# Patient Record
Sex: Male | Born: 1975 | Race: White | Hispanic: No | Marital: Married | Smoking: Former smoker
Health system: Southern US, Community
[De-identification: ages and names within clinical notes are randomized; demographics above are authoritative.]

## PROBLEM LIST (undated history)

## (undated) HISTORY — PX: EYE SURGERY: SHX253

---

## 2006-10-24 ENCOUNTER — Ambulatory Visit: Payer: Self-pay | Admitting: Internal Medicine

## 2009-11-12 ENCOUNTER — Ambulatory Visit: Payer: Self-pay | Admitting: Internal Medicine

## 2011-07-28 ENCOUNTER — Ambulatory Visit: Payer: Self-pay | Admitting: Internal Medicine

## 2011-07-28 LAB — URINALYSIS, COMPLETE
Bacteria: NEGATIVE
Bilirubin,UR: NEGATIVE
Glucose,UR: NEGATIVE mg/dL (ref 0–75)
Ketone: NEGATIVE
Nitrite: NEGATIVE
Protein: NEGATIVE
Specific Gravity: 1.015 (ref 1.003–1.030)
Squamous Epithelial: NONE SEEN

## 2014-02-20 ENCOUNTER — Observation Stay: Payer: Self-pay | Admitting: Surgery

## 2014-02-20 LAB — CBC WITH DIFFERENTIAL/PLATELET
Basophil #: 0.1 10*3/uL (ref 0.0–0.1)
Basophil %: 0.6 %
Eosinophil #: 0.1 10*3/uL (ref 0.0–0.7)
Eosinophil %: 0.4 %
HCT: 48.5 % (ref 40.0–52.0)
HGB: 15.7 g/dL (ref 13.0–18.0)
Lymphocyte #: 1.4 10*3/uL (ref 1.0–3.6)
Lymphocyte %: 7.8 %
MCH: 28.4 pg (ref 26.0–34.0)
MCHC: 32.4 g/dL (ref 32.0–36.0)
MCV: 88 fL (ref 80–100)
Monocyte #: 0.7 x10 3/mm (ref 0.2–1.0)
Monocyte %: 3.8 %
NEUTROS ABS: 15.8 10*3/uL — AB (ref 1.4–6.5)
Neutrophil %: 87.4 %
PLATELETS: 213 10*3/uL (ref 150–440)
RBC: 5.54 10*6/uL (ref 4.40–5.90)
RDW: 13.2 % (ref 11.5–14.5)
WBC: 18.1 10*3/uL — AB (ref 3.8–10.6)

## 2014-02-20 LAB — COMPREHENSIVE METABOLIC PANEL
AST: 25 U/L (ref 15–37)
Albumin: 4.3 g/dL (ref 3.4–5.0)
Alkaline Phosphatase: 93 U/L
Anion Gap: 4 — ABNORMAL LOW (ref 7–16)
BUN: 10 mg/dL (ref 7–18)
Bilirubin,Total: 0.6 mg/dL (ref 0.2–1.0)
CHLORIDE: 104 mmol/L (ref 98–107)
CREATININE: 1.17 mg/dL (ref 0.60–1.30)
Calcium, Total: 9.4 mg/dL (ref 8.5–10.1)
Co2: 29 mmol/L (ref 21–32)
Glucose: 115 mg/dL — ABNORMAL HIGH (ref 65–99)
Osmolality: 274 (ref 275–301)
Potassium: 3.9 mmol/L (ref 3.5–5.1)
SGPT (ALT): 57 U/L
SODIUM: 137 mmol/L (ref 136–145)
TOTAL PROTEIN: 7.9 g/dL (ref 6.4–8.2)

## 2014-02-20 LAB — URINALYSIS, COMPLETE
BILIRUBIN, UR: NEGATIVE
Bacteria: NONE SEEN
Blood: NEGATIVE
Glucose,UR: NEGATIVE mg/dL (ref 0–75)
KETONE: NEGATIVE
Leukocyte Esterase: NEGATIVE
Nitrite: NEGATIVE
PH: 7 (ref 4.5–8.0)
PROTEIN: NEGATIVE
Specific Gravity: 1.028 (ref 1.003–1.030)
Squamous Epithelial: NONE SEEN
WBC UR: 1 /HPF (ref 0–5)

## 2014-02-20 LAB — APTT: Activated PTT: 25.2 secs (ref 23.6–35.9)

## 2014-02-20 LAB — LIPASE, BLOOD: Lipase: 136 U/L (ref 73–393)

## 2014-02-24 LAB — PATHOLOGY REPORT

## 2014-09-11 NOTE — Discharge Summary (Signed)
PATIENT NAME:  Jon [name], Jon [name] DATE OF BIRTH:  22-Dec-1975  DATE OF ADMISSION:  02/20/2014 DATE OF DISCHARGE:  02/21/2014  BRIEF HISTORY: Jon [name] is a 39 year old gentleman admitted through the emergency room with evidence of acute appendicitis. CT evidence and clinical presentation confirmed the diagnosis. He was taken to surgery that afternoon, where he underwent a laparoscopic appendectomy. The procedure was uncomplicated. He had no significant intraoperative problems. He was up, active, tolerating a diet the following morning. His wounds looked good. There was no sign of any infection. He is discharged home today to be followed in the office later this week.   DISCHARGE MEDICATIONS: Include ranitidine 150 mg p.o. daily p.r.n., hydrocodone and acetaminophen 300 mg/5 mg every 4 to 6 hours p.r.n. pain.   FINAL DISCHARGE DIAGNOSIS: Acute appendicitis.   SURGERY: Laparoscopic appendectomy.     ____________________________ Carmie Endalph L. Ely III, MD rle:TT D: 02/21/2014 09:37:00 ET T: 02/21/2014 16:42:50 ET JOB#: [id number]  cc: Carmie Endalph L. Ely III, MD, <Dictator> Marya AmslerMarshall W. Dareen PianoAnderson, MD Quentin OreALPH L ELY MD ELECTRONICALLY SIGNED 02/21/2014 17:48

## 2014-09-11 NOTE — H&P (Signed)
PATIENT NAME:  Jon BuzzardMORRIS, Massey B MR#:  [id number] DATE OF BIRTH:  Apr 28, 1976  DATE OF ADMISSION:  02/20/2014  PRIMARY CARE PHYSICIAN: Marya AmslerMarshall W. Dareen PianoAnderson, MD.   ADMITTING PHYSICIAN:  Quentin Orealph L. Ely III, MD   CHIEF COMPLAINT: Abdominal pain, nausea, vomiting.   BRIEF HISTORY: Jon [name] is a 39 year old gentleman seen in the Emergency Room with approximately a 12-hour history of abdominal pain. He was in his normal state of good health yesterday with no particular complaints. He went to bed normally last evening. He developed significant generalized abdominal pain at 3:00 in the morning waking him from sleep. The pain was followed soon after with profuse nausea and vomiting. The pain migrated to the right lower quadrant. This morning he presented to the emergency room for further evaluation. Denies any previous similar symptoms. He does have a history of nephrolithiasis but no other GI problems. Specifically, he denies a history of hepatitis, yellow jaundice, pancreatitis, peptic ulcer disease, gallbladder disease, or diverticulitis. He does have a history of gastroesophageal reflux disease. He has undergone endoscopy. He does not have any surgical history. He has no major medical problems including no history of cardiac disease, hypertension, diabetes or thyroid problems. He is not a cigarette smoker. He does not drink alcohol regularly.   MEDICATIONS: He takes no medications other than occasional H2-blocker.   ALLERGIES:  No medical allergies.   REVIEW OF SYSTEMS:  Otherwise unremarkable.   FAMILY HISTORY:  Noncontributory to the current illness.   PHYSICAL EXAMINATION:  GENERAL: He is an alert, pleasant gentleman in moderate distress from pain.  VITAL SIGNS: Blood pressure 104/68, heart rate is 68 and regular. He is afebrile.  HEENT: Unremarkable. He has no scleral icterus, no pupillary abnormalities. No facial deformities.  NECK: Supple, nontender, with midline trachea.  CHEST: Clear with  no adventitious sounds. He has normal pulmonary excursion.  CARDIAC: No murmurs or gallops to my ear. He seems to be in normal sinus rhythm.  ABDOMEN: Soft with some generalized right lower quadrant tenderness; guarding, but no rebound. He does have point tenderness in the right lower quadrant. He has active bowel sounds.  EXTREMITIES: Full range of motion. No deformities.  PSYCHIATRIC: Normal orientation, normal affect.   ASSESSMENT AND PLAN: I have independently reviewed his CT scan. He does appear to have a thickened appendix with appendicolith and mild periappendiceal stranding consistent with acute appendicitis. His clinical presentation would support that diagnosis. We will set him up for intervention. I have discussed antibiotic therapy versus surgical intervention with him. He would prefer surgery. His wife is in agreement with the plan.    ____________________________ Carmie Endalph L. Ely III, MD rle:lt D: 02/20/2014 13:51:00 ET T: 02/20/2014 14:25:23 ET JOB#: [id number]  cc: Carmie Endalph L. Ely III, MD, <Dictator> Marya AmslerMarshall W. Dareen PianoAnderson, MD Quentin OreALPH L ELY MD ELECTRONICALLY SIGNED 02/21/2014 9:44

## 2014-09-11 NOTE — Op Note (Signed)
PATIENT NAME:  Jon [name], Jon [name] MR#:  [id number] DATE OF BIRTH:  1975-12-30  DATE OF PROCEDURE:  02/20/2014   PREOPERATIVE DIAGNOSIS: Acute appendicitis.   POSTOPERATIVE DIAGNOSIS: Acute appendicitis.   PROCEDURE PERFORMED: Laparoscopic appendectomy.   ANESTHESIA: General.   SURGEON: Quentin Orealph L. Ely, MD   OPERATIVE PROCEDURE: With the patient is a position, after induction of appropriate general anesthesia, the patient's abdomen was prepped with ChloraPrep and draped with sterile towels. The patient was placed in the head down and feet up position. A small infraumbilical incision was made in the standard fashion, and carried down bluntly through the subcutaneous tissue. A Veress needle was used to cannulate the peritoneal cavity. CO2 was insufflated to appropriate pressure measurements. When approximately 2.5 L of CO2 were instilled, the Veress needle was withdrawn and an 11 mm [employer] port was inserted into the peritoneal cavity. Intraperitoneal position was confirmed. CO2 was reinsufflated. A transverse upper abdominal incision was made in the midline, and an 11 mm port was inserted under direct vision. The right lower quadrant was investigated. The appendix was thickened, inflamed at the tip, mildly edematous. It appeared to be almost completely occluded with debris.   A suprapubic transverse incision was made and a 12 mm port inserted under direct vision. The camera was moved to the upper port and dissection carried out through the 2 lower ports. Mesoappendix was divided with 2 applications of the Endo GIA stapling device carrying a white load. The base of the appendix was divided right at the cecum using a single application of the Endo GIA stapler carrying a blue load. The appendix was captured in an Endo Catch apparatus and removed through the suprapubic incision.   The area was copiously irrigated with warm saline solution. The lower midline 12 mm incision was closed with a  figure-of-eight suture of 0 Vicryl using the suture passer. The abdomen was desufflated. All ports were withdrawn without difficult. The skin incisions were closed with 5-0 nylon. The area was infiltrated with 0.25% Marcaine for postoperative pain control. Sterile dressings were applied. The patient was returned to the recovery room, having tolerated the procedure well. Sponge, needle, and instrument counts were correct x 2 in the operating room.    ____________________________ Quentin Orealph L. Ely III, MD rle:MT D: 02/20/2014 17:21:55 ET T: 02/21/2014 08:55:49 ET JOB#: [id number]  cc: Quentin Orealph L. Ely III, MD, <Dictator> Quentin OreALPH L ELY MD ELECTRONICALLY SIGNED 02/21/2014 9:45

## 2015-07-19 ENCOUNTER — Ambulatory Visit: Admission: EM | Admit: 2015-07-19 | Discharge: 2015-07-19 | Discharge status: Absent without leave | Payer: Self-pay

## 2016-10-17 ENCOUNTER — Encounter: Payer: Self-pay | Admitting: Emergency Medicine

## 2016-10-17 ENCOUNTER — Ambulatory Visit
Admission: EM | Admit: 2016-10-17 | Discharge: 2016-10-17 | Discharge status: Absent without leave | Payer: Managed Care, Other (non HMO)

## 2016-10-17 NOTE — ED Triage Notes (Signed)
Patient c/o cough and chest congestion for 2 days.   

## 2017-07-11 DIAGNOSIS — K8050 Calculus of bile duct without cholangitis or cholecystitis without obstruction: Secondary | ICD-10-CM | POA: Insufficient documentation

## 2017-08-19 HISTORY — PX: CHOLECYSTECTOMY: SHX55

## 2017-12-09 ENCOUNTER — Ambulatory Visit
Admission: EM | Admit: 2017-12-09 | Discharge: 2017-12-09 | Disposition: A | Discharge status: Home / Self Care | Payer: Managed Care, Other (non HMO) | Attending: Family Medicine | Admitting: Family Medicine

## 2017-12-09 DIAGNOSIS — M545 Low back pain: Secondary | ICD-10-CM

## 2017-12-09 DIAGNOSIS — S39012A Strain of muscle, fascia and tendon of lower back, initial encounter: Secondary | ICD-10-CM | POA: Diagnosis not present

## 2017-12-09 DIAGNOSIS — R109 Unspecified abdominal pain: Secondary | ICD-10-CM | POA: Diagnosis not present

## 2017-12-09 LAB — URINALYSIS, COMPLETE (UACMP) WITH MICROSCOPIC
Glucose, UA: NEGATIVE mg/dL
Ketones, ur: NEGATIVE mg/dL
Leukocytes, UA: NEGATIVE
Nitrite: NEGATIVE
PH: 5.5 (ref 5.0–8.0)
Protein, ur: 100 mg/dL — AB
Squamous Epithelial / LPF: NONE SEEN (ref 0–5)

## 2017-12-09 MED ORDER — TAMSULOSIN HCL 0.4 MG PO CAPS: 0.4000 mg | ORAL_CAPSULE | Freq: Every day | ORAL | 0 refills | Status: DC

## 2017-12-09 MED ORDER — CYCLOBENZAPRINE HCL 10 MG PO TABS: 10.0000 mg | ORAL_TABLET | Freq: Every day | ORAL | 0 refills | Status: DC

## 2017-12-09 MED ORDER — HYDROCODONE-ACETAMINOPHEN 5-325 MG PO TABS: ORAL_TABLET | ORAL | 0 refills | Status: DC

## 2017-12-09 NOTE — Discharge Instructions (Signed)
Increase water intake Strain urine

## 2017-12-09 NOTE — ED Provider Notes (Signed)
[facility]    CSN: [id number] Arrival date & time: 12/09/17  1510     History   Chief Complaint Chief Complaint  Patient presents with  . Flank Pain    HPI Jon [name] is a 42 y.o. male.   42 yo male with a c/o right flank pain and right lower back pain for the past 3 days associated with darker urine and pain radiating down towards the right groin. States he has a h/o kidney stones and low back musculoskeletal problems in the past and current symptoms similar to both so he states he's not sure which one it is. Denies any fevers, chills, nausea or vomiting.   The history is provided by the patient.  Flank Pain     History reviewed. No pertinent past medical history.  There are no active problems to display for this patient.   Past Surgical History:  Procedure Laterality Date  . EYE SURGERY         Home Medications    Prior to Admission medications   Medication Sig Start Date End Date Taking? Authorizing Provider  cyclobenzaprine (FLEXERIL) 10 MG tablet Take 1 tablet (10 mg total) by mouth at bedtime. 12/09/17   Payton Mccallumonty, Eadie Repetto, MD  HYDROcodone-acetaminophen (NORCO/VICODIN) 5-325 MG tablet 1-2 tabs po q 8 hours prn 12/09/17   Payton Mccallumonty, Alvino Lechuga, MD  tamsulosin (FLOMAX) 0.4 MG CAPS capsule Take 1 capsule (0.4 mg total) by mouth daily. 12/09/17   Payton Mccallumonty, Damara Klunder, MD    Family History No family history on file.  Social History Social History   Tobacco Use  . Smoking status: Former Games developermoker  . Smokeless tobacco: Current User    Types: Snuff  Substance Use Topics  . Alcohol use: No  . Drug use: Not on file     Allergies   Patient has no known allergies.   Review of Systems Review of Systems  Genitourinary: Positive for flank pain.     Physical Exam Triage Vital Signs ED Triage Vitals  Enc Vitals Group     BP 12/09/17 1526 124/84     Pulse Rate 12/09/17 1526 81     Resp 12/09/17 1526 18     Temp 12/09/17 1526 98.6 F (37 C)     Temp  Source 12/09/17 1526 Oral     SpO2 12/09/17 1526 97 %     Weight 12/09/17 1529 250 lb (113.4 kg)     Height --      Head Circumference --      Peak Flow --      Pain Score 12/09/17 1529 6     Pain Loc --      Pain Edu? --      Excl. in GC? --    No data found.  Updated Vital Signs BP 124/84 (BP Location: Left Arm)   Pulse 81   Temp 98.6 F (37 C) (Oral)   Resp 18   Wt 250 lb (113.4 kg)   SpO2 97%   BMI 36.92 kg/m   Visual Acuity Right Eye Distance:   Left Eye Distance:   Bilateral Distance:    Right Eye Near:   Left Eye Near:    Bilateral Near:     Physical Exam  Constitutional: He appears well-developed and well-nourished. No distress.  Abdominal: Soft. Bowel sounds are normal. He exhibits no distension and no mass. There is tenderness (mild; right flank). There is no rebound and no guarding. No hernia.  Musculoskeletal:  Lumbar back: He exhibits tenderness (lumbar sacral paraspinous muscles).  Skin: He is not diaphoretic.  Nursing note and vitals reviewed.    UC Treatments / Results  Labs (all labs ordered are listed, but only abnormal results are displayed) Labs Reviewed  URINALYSIS, COMPLETE (UACMP) WITH MICROSCOPIC - Abnormal; Notable for the following components:      Result Value   APPearance HAZY (*)    Specific Gravity, Urine >1.030 (*)    Hgb urine dipstick LARGE (*)    Bilirubin Urine SMALL (*)    Protein, ur 100 (*)    Bacteria, UA RARE (*)    All other components within normal limits    EKG None  Radiology No results found.  Procedures Procedures (including critical care time)  Medications Ordered in UC Medications - No data to display  Initial Impression / Assessment and Plan / UC Course  I have reviewed the triage vital signs and the nursing notes.  Pertinent labs & imaging results that were available during my care of the patient were reviewed by me and considered in my medical decision making (see chart for details).        Final Clinical Impressions(s) / UC Diagnoses   Final diagnoses:  Right flank pain  Strain of lumbar region, initial encounter     Discharge Instructions     Increase water intake Strain urine    ED Prescriptions    Medication Sig Dispense Auth. Provider   tamsulosin (FLOMAX) 0.4 MG CAPS capsule Take 1 capsule (0.4 mg total) by mouth daily. 30 capsule Payton Mccallum, MD   cyclobenzaprine (FLEXERIL) 10 MG tablet Take 1 tablet (10 mg total) by mouth at bedtime. 30 tablet Davian Wollenberg, Pamala Hurry, MD   HYDROcodone-acetaminophen (NORCO/VICODIN) 5-325 MG tablet 1-2 tabs po q 8 hours prn 10 tablet Payton Mccallum, MD     1. Lab results and diagnosis reviewed with patient 2. rx as per orders above; reviewed possible side effects, interactions, risks and benefits  3. Recommend supportive treatment as above 4. Follow-up prn if symptoms worsen or don't improve Controlled Substance Prescriptions [place] Controlled Substance Registry consulted? Not Applicable   Payton Mccallum, MD 12/09/17 [phone]

## 2017-12-09 NOTE — ED Triage Notes (Signed)
Started out flank pain on right side since the weekend and states it felt like a kidney stone and now is getting worse and wrapping around his right side and into the right side of his pelvic. Does have a hx of back pain and ibuprofen isn't touching it. Pain is constant. No urinary trouble mentioned.

## 2017-12-16 ENCOUNTER — Encounter: Payer: Self-pay | Admitting: Emergency Medicine

## 2017-12-16 ENCOUNTER — Ambulatory Visit
Admission: EM | Admit: 2017-12-16 | Discharge: 2017-12-16 | Disposition: A | Discharge status: Home / Self Care | Payer: Managed Care, Other (non HMO) | Attending: Family Medicine | Admitting: Family Medicine

## 2017-12-16 ENCOUNTER — Other Ambulatory Visit: Payer: Self-pay

## 2017-12-16 ENCOUNTER — Ambulatory Visit (INDEPENDENT_AMBULATORY_CARE_PROVIDER_SITE_OTHER): Payer: Managed Care, Other (non HMO)

## 2017-12-16 DIAGNOSIS — N200 Calculus of kidney: Secondary | ICD-10-CM | POA: Diagnosis not present

## 2017-12-16 DIAGNOSIS — N1330 Unspecified hydronephrosis: Secondary | ICD-10-CM

## 2017-12-16 DIAGNOSIS — N201 Calculus of ureter: Secondary | ICD-10-CM

## 2017-12-16 DIAGNOSIS — R109 Unspecified abdominal pain: Secondary | ICD-10-CM | POA: Diagnosis not present

## 2017-12-16 LAB — URINALYSIS, COMPLETE (UACMP) WITH MICROSCOPIC
BILIRUBIN URINE: NEGATIVE
Bacteria, UA: NONE SEEN
GLUCOSE, UA: NEGATIVE mg/dL
HGB URINE DIPSTICK: NEGATIVE
Ketones, ur: NEGATIVE mg/dL
NITRITE: NEGATIVE
PH: 5.5 (ref 5.0–8.0)
Protein, ur: NEGATIVE mg/dL
SPECIFIC GRAVITY, URINE: 1.015 (ref 1.005–1.030)

## 2017-12-16 MED ORDER — OXYCODONE-ACETAMINOPHEN 5-325 MG PO TABS: 1.0000 | ORAL_TABLET | Freq: Three times a day (TID) | ORAL | 0 refills | Status: DC | PRN

## 2017-12-16 NOTE — Discharge Instructions (Signed)
[facility]  Continue flomax.  Pain medication as directed.  Take care  Dr. Adriana Simasook

## 2017-12-16 NOTE — ED Provider Notes (Signed)
[facility]    CSN: [id number] Arrival date & time: 12/16/17  1212  History   Chief Complaint Chief Complaint  Patient presents with  . Back Pain   HPI  42 year old male presents with right flank pain.  Patient with ongoing right flank and groin pain.  Was seen on 7/22.  Suspected kidney stone.  Was placed on Flomax, pain medication and was also given Flexeril.  Patient states that he has not passed a stone.  His symptoms have yet to resolve.  However, he states that he has been feeling better over the past 2 days.  No fevers or chills.  No urinary symptoms.  However continues to have intermittent pain.  Pain is severe at times.  No reports of hematuria.  No other associated symptoms.  No other complaints at this time.   Social History Social History   Tobacco Use  . Smoking status: Former Games developermoker  . Smokeless tobacco: Current User    Types: Snuff  Substance Use Topics  . Alcohol use: No  . Drug use: Not on file     Allergies   Patient has no known allergies.   Review of Systems Review of Systems  Constitutional: Negative.   Genitourinary: Positive for flank pain.   Physical Exam Triage Vital Signs ED Triage Vitals  Enc Vitals Group     BP 12/16/17 1240 (!) 150/96     Pulse Rate 12/16/17 1240 70     Resp 12/16/17 1240 16     Temp 12/16/17 1240 98.6 F (37 C)     Temp Source 12/16/17 1240 Oral     SpO2 12/16/17 1240 98 %     Weight 12/16/17 1239 250 lb (113.4 kg)     Height 12/16/17 1239 5\' 10"  (1.778 m)     Head Circumference --      Peak Flow --      Pain Score 12/16/17 1237 1     Pain Loc --      Pain Edu? --      Excl. in GC? --    Updated Vital Signs BP (!) 150/96 (BP Location: Left Arm)   Pulse 70   Temp 98.6 F (37 C) (Oral)   Resp 16   Ht 5\' 10"  (1.778 m)   Wt 250 lb (113.4 kg)   SpO2 98%   BMI 35.87 kg/m   Visual Acuity Right Eye Distance:   Left Eye Distance:   Bilateral Distance:    Right Eye Near:   Left Eye Near:      Bilateral Near:     Physical Exam  Constitutional: He is oriented to person, place, and time. He appears well-developed. No distress.  HENT:  Head: Normocephalic and atraumatic.  Cardiovascular: Normal rate and regular rhythm.  Pulmonary/Chest: Effort normal and breath sounds normal.  Abdominal: Soft. He exhibits no distension. There is no tenderness.  Neurological: He is alert and oriented to person, place, and time.  Psychiatric: He has a normal mood and affect. His behavior is normal.  Nursing note and vitals reviewed.  UC Treatments / Results  Labs (all labs ordered are listed, but only abnormal results are displayed) Labs Reviewed  URINALYSIS, COMPLETE (UACMP) WITH MICROSCOPIC - Abnormal; Notable for the following components:      Result Value   Leukocytes, UA TRACE (*)    All other components within normal limits    EKG None  Radiology Ct Renal Stone Study  Result Date: 12/16/2017 CLINICAL DATA:  Right flank pain, gross hematuria. EXAM: CT ABDOMEN AND PELVIS WITHOUT CONTRAST TECHNIQUE: Multidetector CT imaging of the abdomen and pelvis was performed following the standard protocol without IV contrast. COMPARISON:  CT scan of February 20, 2014. FINDINGS: Lower chest: No acute abnormality. Hepatobiliary: No focal liver abnormality is seen. Status post cholecystectomy. No biliary dilatation. Pancreas: Unremarkable. No pancreatic ductal dilatation or surrounding inflammatory changes. Spleen: Normal in size without focal abnormality. Adrenals/Urinary Tract: Adrenal glands appear normal. Bilateral nephrolithiasis is noted. Mild right hydroureteronephrosis is noted secondary to 7 mm calculus in midportion of right ureter. There is also noted 11 x 7 mm calculus in distal right ureter just proximal to ureterovesical junction. Urinary bladder is otherwise unremarkable. Stomach/Bowel: The stomach appears normal. Status post appendectomy. There is no evidence of bowel obstruction or  inflammation. Vascular/Lymphatic: No significant vascular findings are present. No enlarged abdominal or pelvic lymph nodes. Reproductive: Prostate is unremarkable. Other: No abdominal wall hernia or abnormality. No abdominopelvic ascites. Musculoskeletal: No acute or significant osseous findings. IMPRESSION: Bilateral nephrolithiasis. Mild right hydroureteronephrosis is noted with 7 mm calculus in midportion of right ureter as well as 11 x 7 mm calculus in distal right ureter just proximal to ureterovesical junction. Electronically Signed   By: Lupita Raider, M.D.   On: 12/16/2017 13:33    Procedures Procedures (including critical care time)  Medications Ordered in UC Medications - No data to display  Initial Impression / Assessment and Plan / UC Course  I have reviewed the triage vital signs and the nursing notes.  Pertinent labs & imaging results that were available during my care of the patient were reviewed by me and considered in my medical decision making (see chart for details).    42 year old male presents with suspected stone.  CT confirmed.  Patient has 2 stones.  Continue Flomax.  Pain medication given after consultation with the [place] controlled substance database.  Advised to see urology as he will likely need lithotripsy.  Final Clinical Impressions(s) / UC Diagnoses   Final diagnoses:  Ureterolithiasis     Discharge Instructions     Call to see [facility].  Continue flomax.  Pain medication as directed.  Take care  Dr. Adriana Simas    ED Prescriptions    Medication Sig Dispense Auth. Provider   oxyCODONE-acetaminophen (PERCOCET/ROXICET) 5-325 MG tablet Take 1 tablet by mouth every 8 (eight) hours as needed for severe pain. 15 tablet Tommie Sams, DO     Controlled Substance Prescriptions [place] Controlled Substance Registry consulted? Not Applicable   Tommie Sams, DO 12/16/17 1419

## 2017-12-16 NOTE — ED Triage Notes (Signed)
Pt here c/o flank pain pain. He was seen on 12/09/17 for the same pain. Strain vs. Kidney stone. Pt says his pain is not getting better. He was given Flomax, flexeril, and hydrocodone/APAP. He did not feel any relief with these.

## 2017-12-18 ENCOUNTER — Encounter: Payer: Self-pay | Admitting: Urology

## 2017-12-18 ENCOUNTER — Ambulatory Visit (INDEPENDENT_AMBULATORY_CARE_PROVIDER_SITE_OTHER): Payer: Managed Care, Other (non HMO) | Admitting: Urology

## 2017-12-18 ENCOUNTER — Telehealth: Payer: Self-pay | Admitting: Radiology

## 2017-12-18 ENCOUNTER — Other Ambulatory Visit: Payer: Self-pay | Admitting: Radiology

## 2017-12-18 ENCOUNTER — Other Ambulatory Visit: Payer: Self-pay

## 2017-12-18 VITALS — BP 125/78 | HR 70 | Ht 70.0 in | Wt 250.0 lb

## 2017-12-18 DIAGNOSIS — N200 Calculus of kidney: Secondary | ICD-10-CM

## 2017-12-18 DIAGNOSIS — N201 Calculus of ureter: Secondary | ICD-10-CM

## 2017-12-18 DIAGNOSIS — N23 Unspecified renal colic: Secondary | ICD-10-CM | POA: Diagnosis not present

## 2017-12-18 LAB — MICROSCOPIC EXAMINATION
BACTERIA UA: NONE SEEN
EPITHELIAL CELLS (NON RENAL): NONE SEEN /HPF (ref 0–10)

## 2017-12-18 LAB — URINALYSIS, COMPLETE
BILIRUBIN UA: NEGATIVE
Glucose, UA: NEGATIVE
Ketones, UA: NEGATIVE
LEUKOCYTES UA: NEGATIVE
Nitrite, UA: NEGATIVE
PH UA: 5.5 (ref 5.0–7.5)
PROTEIN UA: NEGATIVE
Specific Gravity, UA: 1.015 (ref 1.005–1.030)
Urobilinogen, Ur: 0.2 mg/dL (ref 0.2–1.0)

## 2017-12-18 NOTE — Telephone Encounter (Signed)
No ultrasound needed.

## 2017-12-18 NOTE — H&P (View-Only) (Signed)
12/18/2017 8:38 AM   Jon [name] 05-02-1976 [id number]  Referring provider: [facility], [facility] [address] [facility] [place], [place] [address]  Chief Complaint  Patient presents with  . Nephrolithiasis    HPI: 42 year old male presents for evaluation of ureteral colic secondary to right ureteral calculi.  He presented to the ED on 12/09/2017 complaining of right flank pain.  He was diagnosed with lumbar strain but was also given tamsulosin for a possible stone.  He has had intermittent pain for the past 2 weeks.  On 12/16/2017 he had increased pain which was more localized to the right pelvis/right lower quadrant.  He denied fever, chills.  No bothersome lower urinary tract symptoms.  There were no identifiable precipitating, aggravating or alleviating factors to his pain.  The pain was intermittent and described as severe at times.  He went back to the ED on 7/29 and a CT was performed which showed an 8 x 11 mm right distal ureteral calculus and a 7 mm right lower proximal calculus with mild hydronephrosis/hydroureter.  He was discharged on oxycodone.  His pain has been controlled.  He has a long history of recurrent stone disease and states he usually passes 2-3 stones per year.  He has never required urologic intervention.  His CT did show small, bilateral renal calculi.   PMH: History reviewed. No pertinent past medical history.  Surgical History: Past Surgical History:  Procedure Laterality Date  . CHOLECYSTECTOMY  08/2017  . EYE SURGERY      Home Medications:  Allergies as of 12/18/2017      Reactions   Vancomycin Itching      Medication List        Accurate as of 12/18/17  8:38 AM. Always use your most recent med list.          oxyCODONE-acetaminophen 5-325 MG tablet Commonly known as:  PERCOCET/ROXICET Take 1 tablet by mouth every 8 (eight) hours as needed for severe pain.   tamsulosin 0.4 MG Caps capsule Commonly known as:  FLOMAX Take 1  capsule (0.4 mg total) by mouth daily.       Allergies:  Allergies  Allergen Reactions  . Vancomycin Itching    Family History: History reviewed. No pertinent family history.  Social History:  reports that he has quit smoking. His smokeless tobacco use includes snuff. He reports that he does not drink alcohol. His drug history is not on file.  ROS: UROLOGY Frequent Urination?: No Hard to postpone urination?: No Burning/pain with urination?: No Get up at night to urinate?: No Leakage of urine?: No Urine stream starts and stops?: No Trouble starting stream?: No Do you have to strain to urinate?: No Blood in urine?: Yes Urinary tract infection?: No Sexually transmitted disease?: No Injury to kidneys or bladder?: No Painful intercourse?: No Weak stream?: No Erection problems?: No Penile pain?: No  Gastrointestinal Nausea?: No Vomiting?: No Indigestion/heartburn?: No Diarrhea?: No Constipation?: No  Constitutional Fever: No Night sweats?: No Weight loss?: No Fatigue?: No  Skin Skin rash/lesions?: No Itching?: No  Eyes Blurred vision?: No Double vision?: No  Ears/Nose/Throat Sore throat?: No Sinus problems?: No  Hematologic/Lymphatic Swollen glands?: No Easy bruising?: No  Cardiovascular Leg swelling?: No Chest pain?: No  Respiratory Cough?: No Shortness of breath?: No  Endocrine Excessive thirst?: No  Musculoskeletal Back pain?: Yes Joint pain?: No  Neurological Headaches?: No Dizziness?: No  Psychologic Depression?: No Anxiety?: No  Physical Exam: BP 125/78   Pulse 70  Ht 5\' 10"  (1.778 m)   Wt 250 lb (113.4 kg)   BMI 35.87 kg/m   Constitutional:  Alert and oriented, No acute distress. HEENT: [place] AT, moist mucus membranes.  Trachea midline, no masses. Cardiovascular: No clubbing, cyanosis, or edema.  RRR Respiratory: Normal respiratory effort, no increased work of breathing.  Lungs clear GI: Abdomen is soft, nontender,  nondistended, no abdominal masses GU: No CVA tenderness Lymph: No cervical or inguinal lymphadenopathy. Skin: No rashes, bruises or suspicious lesions. Neurologic: Grossly intact, no focal deficits, moving all 4 extremities. Psychiatric: Normal mood and affect.  Laboratory Data: Dipstick trace blood/leukocyte negative; microscopy negative Pertinent Imaging: CT personally reviewed  Results for orders placed during the hospital encounter of 12/16/17  CT Renal Stone Study   Narrative CLINICAL DATA:  Right flank pain, gross hematuria.  EXAM: CT ABDOMEN AND PELVIS WITHOUT CONTRAST  TECHNIQUE: Multidetector CT imaging of the abdomen and pelvis was performed following the standard protocol without IV contrast.  COMPARISON:  CT scan of February 20, 2014.  FINDINGS: Lower chest: No acute abnormality.  Hepatobiliary: No focal liver abnormality is seen. Status post cholecystectomy. No biliary dilatation.  Pancreas: Unremarkable. No pancreatic ductal dilatation or surrounding inflammatory changes.  Spleen: Normal in size without focal abnormality.  Adrenals/Urinary Tract: Adrenal glands appear normal. Bilateral nephrolithiasis is noted. Mild right hydroureteronephrosis is noted secondary to 7 mm calculus in midportion of right ureter. There is also noted 11 x 7 mm calculus in distal right ureter just proximal to ureterovesical junction. Urinary bladder is otherwise unremarkable.  Stomach/Bowel: The stomach appears normal. Status post appendectomy. There is no evidence of bowel obstruction or inflammation.  Vascular/Lymphatic: No significant vascular findings are present. No enlarged abdominal or pelvic lymph nodes.  Reproductive: Prostate is unremarkable.  Other: No abdominal wall hernia or abnormality. No abdominopelvic ascites.  Musculoskeletal: No acute or significant osseous findings.  IMPRESSION: Bilateral nephrolithiasis. Mild right hydroureteronephrosis is  noted with 7 mm calculus in midportion of right ureter as well as 11 x 7 mm calculus in distal right ureter just proximal to ureterovesical junction.   Electronically Signed   By: Lupita Raider, M.D.   On: 12/16/2017 13:33     Assessment & Plan:   42 year old male with right renal colic secondary to right ureteral calculi.  Based on stone size he was informed it is unlikely he will be able to pass spontaneously.  Treatment options were discussed including ureteroscopy and shockwave lithotripsy.  The pros and cons of each procedure were reviewed in detail.  He elects to have the procedure which will get him better the quickest and will schedule ureteroscopic removal this week.  The indications and nature of the planned procedure were discussed as well as the potential  benefits and expected outcome.  Alternatives have been discussed in detail. The most common complications and side effects were discussed including but not limited to infection/sepsis; blood loss; damage to urethra, bladder, ureter, kidney; need for multiple surgeries; need for prolonged stent placement as well as general anesthesia risks. Although uncommon he was also informed of the possibility that the calculus may not be able to be treated due to inability to obtain access to the upper ureter. In that event he would require stent placement and a follow-up procedure after a period of stent dilation. All of his questions were answered and he desires to proceed.  At the time of our visit he did not appear to be distracted or in pain.  Abbie Sons, [facility] [address] [place], [place] [address] [phone]

## 2017-12-18 NOTE — Telephone Encounter (Signed)
Does patient need a RUS prior to 1 month follow up visit?

## 2017-12-18 NOTE — Progress Notes (Signed)
12/18/2017 8:38 AM   Jon [name] B Doorn 05-02-1976 [id number]  Referring provider: [facility], [facility] [address] [facility] [place], [place] [address]  Chief Complaint  Patient presents with  . Nephrolithiasis    HPI: 42 year old male presents for evaluation of ureteral colic secondary to right ureteral calculi.  He presented to the ED on 12/09/2017 complaining of right flank pain.  He was diagnosed with lumbar strain but was also given tamsulosin for a possible stone.  He has had intermittent pain for the past 2 weeks.  On 12/16/2017 he had increased pain which was more localized to the right pelvis/right lower quadrant.  He denied fever, chills.  No bothersome lower urinary tract symptoms.  There were no identifiable precipitating, aggravating or alleviating factors to his pain.  The pain was intermittent and described as severe at times.  He went back to the ED on 7/29 and a CT was performed which showed an 8 x 11 mm right distal ureteral calculus and a 7 mm right lower proximal calculus with mild hydronephrosis/hydroureter.  He was discharged on oxycodone.  His pain has been controlled.  He has a long history of recurrent stone disease and states he usually passes 2-3 stones per year.  He has never required urologic intervention.  His CT did show small, bilateral renal calculi.   PMH: History reviewed. No pertinent past medical history.  Surgical History: Past Surgical History:  Procedure Laterality Date  . CHOLECYSTECTOMY  08/2017  . EYE SURGERY      Home Medications:  Allergies as of 12/18/2017      Reactions   Vancomycin Itching      Medication List        Accurate as of 12/18/17  8:38 AM. Always use your most recent med list.          oxyCODONE-acetaminophen 5-325 MG tablet Commonly known as:  PERCOCET/ROXICET Take 1 tablet by mouth every 8 (eight) hours as needed for severe pain.   tamsulosin 0.4 MG Caps capsule Commonly known as:  FLOMAX Take 1  capsule (0.4 mg total) by mouth daily.       Allergies:  Allergies  Allergen Reactions  . Vancomycin Itching    Family History: History reviewed. No pertinent family history.  Social History:  reports that he has quit smoking. His smokeless tobacco use includes snuff. He reports that he does not drink alcohol. His drug history is not on file.  ROS: UROLOGY Frequent Urination?: No Hard to postpone urination?: No Burning/pain with urination?: No Get up at night to urinate?: No Leakage of urine?: No Urine stream starts and stops?: No Trouble starting stream?: No Do you have to strain to urinate?: No Blood in urine?: Yes Urinary tract infection?: No Sexually transmitted disease?: No Injury to kidneys or bladder?: No Painful intercourse?: No Weak stream?: No Erection problems?: No Penile pain?: No  Gastrointestinal Nausea?: No Vomiting?: No Indigestion/heartburn?: No Diarrhea?: No Constipation?: No  Constitutional Fever: No Night sweats?: No Weight loss?: No Fatigue?: No  Skin Skin rash/lesions?: No Itching?: No  Eyes Blurred vision?: No Double vision?: No  Ears/Nose/Throat Sore throat?: No Sinus problems?: No  Hematologic/Lymphatic Swollen glands?: No Easy bruising?: No  Cardiovascular Leg swelling?: No Chest pain?: No  Respiratory Cough?: No Shortness of breath?: No  Endocrine Excessive thirst?: No  Musculoskeletal Back pain?: Yes Joint pain?: No  Neurological Headaches?: No Dizziness?: No  Psychologic Depression?: No Anxiety?: No  Physical Exam: BP 125/78   Pulse 70  Ht 5\' 10"  (1.778 m)   Wt 250 lb (113.4 kg)   BMI 35.87 kg/m   Constitutional:  Alert and oriented, No acute distress. HEENT: [place] AT, moist mucus membranes.  Trachea midline, no masses. Cardiovascular: No clubbing, cyanosis, or edema.  RRR Respiratory: Normal respiratory effort, no increased work of breathing.  Lungs clear GI: Abdomen is soft, nontender,  nondistended, no abdominal masses GU: No CVA tenderness Lymph: No cervical or inguinal lymphadenopathy. Skin: No rashes, bruises or suspicious lesions. Neurologic: Grossly intact, no focal deficits, moving all 4 extremities. Psychiatric: Normal mood and affect.  Laboratory Data: Dipstick trace blood/leukocyte negative; microscopy negative Pertinent Imaging: CT personally reviewed  Results for orders placed during the hospital encounter of 12/16/17  CT Renal Stone Study   Narrative CLINICAL DATA:  Right flank pain, gross hematuria.  EXAM: CT ABDOMEN AND PELVIS WITHOUT CONTRAST  TECHNIQUE: Multidetector CT imaging of the abdomen and pelvis was performed following the standard protocol without IV contrast.  COMPARISON:  CT scan of February 20, 2014.  FINDINGS: Lower chest: No acute abnormality.  Hepatobiliary: No focal liver abnormality is seen. Status post cholecystectomy. No biliary dilatation.  Pancreas: Unremarkable. No pancreatic ductal dilatation or surrounding inflammatory changes.  Spleen: Normal in size without focal abnormality.  Adrenals/Urinary Tract: Adrenal glands appear normal. Bilateral nephrolithiasis is noted. Mild right hydroureteronephrosis is noted secondary to 7 mm calculus in midportion of right ureter. There is also noted 11 x 7 mm calculus in distal right ureter just proximal to ureterovesical junction. Urinary bladder is otherwise unremarkable.  Stomach/Bowel: The stomach appears normal. Status post appendectomy. There is no evidence of bowel obstruction or inflammation.  Vascular/Lymphatic: No significant vascular findings are present. No enlarged abdominal or pelvic lymph nodes.  Reproductive: Prostate is unremarkable.  Other: No abdominal wall hernia or abnormality. No abdominopelvic ascites.  Musculoskeletal: No acute or significant osseous findings.  IMPRESSION: Bilateral nephrolithiasis. Mild right hydroureteronephrosis is  noted with 7 mm calculus in midportion of right ureter as well as 11 x 7 mm calculus in distal right ureter just proximal to ureterovesical junction.   Electronically Signed   By: Lupita Raider, M.D.   On: 12/16/2017 13:33     Assessment & Plan:   42 year old male with right renal colic secondary to right ureteral calculi.  Based on stone size he was informed it is unlikely he will be able to pass spontaneously.  Treatment options were discussed including ureteroscopy and shockwave lithotripsy.  The pros and cons of each procedure were reviewed in detail.  He elects to have the procedure which will get him better the quickest and will schedule ureteroscopic removal this week.  The indications and nature of the planned procedure were discussed as well as the potential  benefits and expected outcome.  Alternatives have been discussed in detail. The most common complications and side effects were discussed including but not limited to infection/sepsis; blood loss; damage to urethra, bladder, ureter, kidney; need for multiple surgeries; need for prolonged stent placement as well as general anesthesia risks. Although uncommon he was also informed of the possibility that the calculus may not be able to be treated due to inability to obtain access to the upper ureter. In that event he would require stent placement and a follow-up procedure after a period of stent dilation. All of his questions were answered and he desires to proceed.  At the time of our visit he did not appear to be distracted or in pain.  Abbie Sons, [facility] [address] [place], [place] [address] [phone]

## 2017-12-19 ENCOUNTER — Encounter: Payer: Self-pay | Admitting: Urology

## 2017-12-19 DIAGNOSIS — N23 Unspecified renal colic: Secondary | ICD-10-CM | POA: Insufficient documentation

## 2017-12-19 DIAGNOSIS — N200 Calculus of kidney: Secondary | ICD-10-CM | POA: Insufficient documentation

## 2017-12-19 DIAGNOSIS — N201 Calculus of ureter: Secondary | ICD-10-CM | POA: Insufficient documentation

## 2017-12-19 MED ORDER — CEFAZOLIN SODIUM-DEXTROSE 2-4 GM/100ML-% IV SOLN
2.0000 g | INTRAVENOUS | Status: AC
  Administered 2017-12-20: 2 g via INTRAVENOUS

## 2017-12-19 NOTE — Telephone Encounter (Signed)
[facility] Need to give instructions regarding surgery scheduled 12/20/2017.

## 2017-12-20 ENCOUNTER — Other Ambulatory Visit: Payer: Self-pay

## 2017-12-20 ENCOUNTER — Ambulatory Visit: Payer: Managed Care, Other (non HMO) | Admitting: Anesthesiology

## 2017-12-20 ENCOUNTER — Ambulatory Visit
Admission: RE | Admit: 2017-12-20 | Discharge: 2017-12-20 | Disposition: A | Discharge status: Home / Self Care | Payer: Managed Care, Other (non HMO) | Source: Ambulatory Visit | Attending: Urology | Admitting: Urology

## 2017-12-20 ENCOUNTER — Encounter
Admission: RE | Disposition: A | Discharge status: Home / Self Care | Payer: Self-pay | Source: Ambulatory Visit | Attending: Urology

## 2017-12-20 ENCOUNTER — Encounter: Payer: Self-pay | Admitting: Anesthesiology

## 2017-12-20 DIAGNOSIS — Z9049 Acquired absence of other specified parts of digestive tract: Secondary | ICD-10-CM | POA: Diagnosis not present

## 2017-12-20 DIAGNOSIS — Z881 Allergy status to other antibiotic agents status: Secondary | ICD-10-CM | POA: Diagnosis not present

## 2017-12-20 DIAGNOSIS — N201 Calculus of ureter: Secondary | ICD-10-CM | POA: Diagnosis not present

## 2017-12-20 DIAGNOSIS — N132 Hydronephrosis with renal and ureteral calculous obstruction: Secondary | ICD-10-CM | POA: Diagnosis present

## 2017-12-20 DIAGNOSIS — E669 Obesity, unspecified: Secondary | ICD-10-CM | POA: Diagnosis not present

## 2017-12-20 DIAGNOSIS — Z6835 Body mass index (BMI) 35.0-35.9, adult: Secondary | ICD-10-CM | POA: Insufficient documentation

## 2017-12-20 DIAGNOSIS — Z87891 Personal history of nicotine dependence: Secondary | ICD-10-CM | POA: Insufficient documentation

## 2017-12-20 DIAGNOSIS — N200 Calculus of kidney: Secondary | ICD-10-CM

## 2017-12-20 HISTORY — PX: CYSTOSCOPY/URETEROSCOPY/HOLMIUM LASER/STENT PLACEMENT: SHX6546

## 2017-12-20 SURGERY — CYSTOSCOPY/URETEROSCOPY/HOLMIUM LASER/STENT PLACEMENT
Anesthesia: General | Site: Ureter | Laterality: Right | Wound class: Clean Contaminated

## 2017-12-20 MED ORDER — FENTANYL CITRATE (PF) 100 MCG/2ML IJ SOLN
INTRAMUSCULAR | Status: DC | PRN
  Administered 2017-12-20: 50 ug via INTRAVENOUS
  Administered 2017-12-20 (×2): 25 ug via INTRAVENOUS

## 2017-12-20 MED ORDER — ONDANSETRON HCL 4 MG/2ML IJ SOLN
INTRAMUSCULAR | Status: DC | PRN
  Administered 2017-12-20: 4 mg via INTRAVENOUS

## 2017-12-20 MED ORDER — DEXAMETHASONE SODIUM PHOSPHATE 10 MG/ML IJ SOLN
INTRAMUSCULAR | Status: DC | PRN
  Administered 2017-12-20: 10 mg via INTRAVENOUS

## 2017-12-20 MED ORDER — FAMOTIDINE 20 MG PO TABS
ORAL_TABLET | ORAL | Status: AC
  Administered 2017-12-20: 20 mg via ORAL
  Filled 2017-12-20: qty 1

## 2017-12-20 MED ORDER — LIDOCAINE HCL (CARDIAC) PF 100 MG/5ML IV SOSY
PREFILLED_SYRINGE | INTRAVENOUS | Status: DC | PRN
  Administered 2017-12-20: 100 mg via INTRAVENOUS

## 2017-12-20 MED ORDER — PROPOFOL 10 MG/ML IV BOLUS
INTRAVENOUS | Status: DC | PRN
  Administered 2017-12-20: 200 mg via INTRAVENOUS

## 2017-12-20 MED ORDER — EPHEDRINE SULFATE 50 MG/ML IJ SOLN
INTRAMUSCULAR | Status: DC | PRN
  Administered 2017-12-20: 10 mg via INTRAVENOUS

## 2017-12-20 MED ORDER — ACETAMINOPHEN 10 MG/ML IV SOLN
INTRAVENOUS | Status: AC
  Filled 2017-12-20: qty 100

## 2017-12-20 MED ORDER — OXYBUTYNIN CHLORIDE 5 MG PO TABS
ORAL_TABLET | ORAL | Status: AC
  Administered 2017-12-20: 5 mg
  Filled 2017-12-20: qty 1

## 2017-12-20 MED ORDER — CEFAZOLIN SODIUM-DEXTROSE 2-4 GM/100ML-% IV SOLN
INTRAVENOUS | Status: AC
  Filled 2017-12-20: qty 100

## 2017-12-20 MED ORDER — GLYCOPYRROLATE 0.2 MG/ML IJ SOLN
INTRAMUSCULAR | Status: AC
  Filled 2017-12-20: qty 1

## 2017-12-20 MED ORDER — MIDAZOLAM HCL 2 MG/2ML IJ SOLN
INTRAMUSCULAR | Status: DC | PRN
  Administered 2017-12-20: 2 mg via INTRAVENOUS

## 2017-12-20 MED ORDER — GLYCOPYRROLATE 0.2 MG/ML IJ SOLN
INTRAMUSCULAR | Status: DC | PRN
  Administered 2017-12-20: 0.2 mg via INTRAVENOUS

## 2017-12-20 MED ORDER — LACTATED RINGERS IV SOLN
INTRAVENOUS | Status: DC
  Administered 2017-12-20: 11:00:00 via INTRAVENOUS

## 2017-12-20 MED ORDER — OXYBUTYNIN CHLORIDE 5 MG PO TABS: ORAL_TABLET | ORAL | 0 refills | Status: DC

## 2017-12-20 MED ORDER — PROPOFOL 10 MG/ML IV BOLUS
INTRAVENOUS | Status: AC
  Filled 2017-12-20: qty 20

## 2017-12-20 MED ORDER — IOTHALAMATE MEGLUMINE 43 % IV SOLN
INTRAVENOUS | Status: DC | PRN
  Administered 2017-12-20: 15 mL via URETHRAL

## 2017-12-20 MED ORDER — LIDOCAINE HCL (PF) 2 % IJ SOLN
INTRAMUSCULAR | Status: AC
  Filled 2017-12-20: qty 10

## 2017-12-20 MED ORDER — ONDANSETRON HCL 4 MG/2ML IJ SOLN: 4.0000 mg | Freq: Once | INTRAMUSCULAR | Status: DC | PRN

## 2017-12-20 MED ORDER — FENTANYL CITRATE (PF) 100 MCG/2ML IJ SOLN
INTRAMUSCULAR | Status: AC
  Filled 2017-12-20: qty 2

## 2017-12-20 MED ORDER — DEXAMETHASONE SODIUM PHOSPHATE 10 MG/ML IJ SOLN
INTRAMUSCULAR | Status: AC
  Filled 2017-12-20: qty 1

## 2017-12-20 MED ORDER — ACETAMINOPHEN 10 MG/ML IV SOLN
INTRAVENOUS | Status: DC | PRN
  Administered 2017-12-20: 1000 mg via INTRAVENOUS

## 2017-12-20 MED ORDER — ONDANSETRON HCL 4 MG/2ML IJ SOLN
INTRAMUSCULAR | Status: AC
  Filled 2017-12-20: qty 2

## 2017-12-20 MED ORDER — FENTANYL CITRATE (PF) 100 MCG/2ML IJ SOLN: 25.0000 ug | INTRAMUSCULAR | Status: DC | PRN

## 2017-12-20 MED ORDER — FAMOTIDINE 20 MG PO TABS
20.0000 mg | ORAL_TABLET | Freq: Once | ORAL | Status: AC
  Administered 2017-12-20: 20 mg via ORAL

## 2017-12-20 MED ORDER — PHENYLEPHRINE HCL 10 MG/ML IJ SOLN
INTRAMUSCULAR | Status: DC | PRN
  Administered 2017-12-20: 100 ug via INTRAVENOUS

## 2017-12-20 MED ORDER — MIDAZOLAM HCL 2 MG/2ML IJ SOLN
INTRAMUSCULAR | Status: AC
  Filled 2017-12-20: qty 2

## 2017-12-20 SURGICAL SUPPLY — 27 items
BAG DRAIN CYSTO-URO LG1000N (MISCELLANEOUS) ×3 IMPLANT
BASKET ZERO TIP 1.9FR (BASKET) ×3 IMPLANT
BRUSH SCRUB EZ 1% IODOPHOR (MISCELLANEOUS) ×3 IMPLANT
CATH URETL 5X70 OPEN END (CATHETERS) ×3 IMPLANT
CNTNR SPEC 2.5X3XGRAD LEK (MISCELLANEOUS) ×1
CONRAY 43 FOR UROLOGY 50M (MISCELLANEOUS) ×3 IMPLANT
CONT SPEC 4OZ STER OR WHT (MISCELLANEOUS) ×2
CONTAINER SPEC 2.5X3XGRAD LEK (MISCELLANEOUS) ×1 IMPLANT
DRAPE UTILITY 15X26 TOWEL STRL (DRAPES) ×3 IMPLANT
FIBER LASER LITHO 273 (Laser) ×3 IMPLANT
GLOVE BIO SURGEON STRL SZ8 (GLOVE) ×3 IMPLANT
GOWN STRL REUS W/ TWL LRG LVL3 (GOWN DISPOSABLE) ×2 IMPLANT
GOWN STRL REUS W/TWL LRG LVL3 (GOWN DISPOSABLE) ×4
INFUSOR MANOMETER BAG 3000ML (MISCELLANEOUS) ×3 IMPLANT
INTRODUCER DILATOR DOUBLE (INTRODUCER) IMPLANT
KIT TURNOVER CYSTO (KITS) ×3 IMPLANT
PACK CYSTO AR (MISCELLANEOUS) ×3 IMPLANT
SENSORWIRE 0.038 NOT ANGLED (WIRE) ×6
SET CYSTO W/LG BORE CLAMP LF (SET/KITS/TRAYS/PACK) ×3 IMPLANT
SHEATH URETERAL 12FRX35CM (MISCELLANEOUS) IMPLANT
SOL .9 NS 3000ML IRR  AL (IV SOLUTION) ×2
SOL .9 NS 3000ML IRR UROMATIC (IV SOLUTION) ×1 IMPLANT
STENT URET 6FRX24 CONTOUR (STENTS) IMPLANT
STENT URET 6FRX26 CONTOUR (STENTS) ×3 IMPLANT
SURGILUBE 2OZ TUBE FLIPTOP (MISCELLANEOUS) ×3 IMPLANT
WATER STERILE IRR 1000ML POUR (IV SOLUTION) ×3 IMPLANT
WIRE SENSOR 0.038 NOT ANGLED (WIRE) ×2 IMPLANT

## 2017-12-20 NOTE — Discharge Instructions (Signed)

## 2017-12-20 NOTE — Transfer of Care (Signed)
Immediate Anesthesia Transfer of Care Note  Patient: Jon BuzzardRobert B [name]  Procedure(s) Performed: Procedure(s): CYSTOSCOPY/URETEROSCOPY/HOLMIUM LASER/STENT PLACEMENT (Right)  Patient Location: PACU  Anesthesia Type:General  Level of Consciousness: sedated  Airway & Oxygen Therapy: Patient Spontanous Breathing and Patient connected to face mask oxygen  Post-op Assessment: Report given to RN and Post -op Vital signs reviewed and stable  Post vital signs: Reviewed and stable  Last Vitals:  Vitals:   12/20/17 1031 12/20/17 1250  BP: 129/85 (!) 135/96  Pulse: 69 94  Resp: 16 13  Temp: 37.1 C (!) 36.2 C  SpO2: 99% 98%    Complications: No apparent anesthesia complications

## 2017-12-20 NOTE — Op Note (Signed)
Preoperative diagnosis: Right ureteral calculus  Postoperative diagnosis: Right ureteral calculus  Procedure:  1. Cystoscopy 2. Right ureteroscopy and stone removal 3. Ureteroscopic laser lithotripsy 4. Right ureteral stent placement (6Fr) 26cm 5. Right retrograde pyelography with interpretation  Surgeon: Lorin Picket C. Ismar Yabut, M.D.  Anesthesia: General  Complications: None  Intraoperative findings:  1.Right retrograde pyelography post procedure showed no filling defects, stone fragments or contrast extravasation  EBL: Minimal  Specimens: 1. Calculus fragments for analysis   Indication: Jon [name] is a 42 y.o. year old patient who recently presented to the ED complaining of right flank pain.  CT showed an 8 x 11 mm right distal ureteral calculus and a 7 mm right lower proximal ureteral calculus.  After reviewing the management options for treatment, the patient elected to proceed with the above surgical procedure(s). We have discussed the potential benefits and risks of the procedure, side effects of the proposed treatment, the likelihood of the patient achieving the goals of the procedure, and any potential problems that might occur during the procedure or recuperation. Informed consent has been obtained.  Description of procedure:  The patient was taken to the operating room and general anesthesia was induced.  The patient was placed in the dorsal lithotomy position, prepped and draped in the usual sterile fashion, and preoperative antibiotics were administered. A preoperative time-out was performed.   A 21 French cystoscope was lubricated and passed under direct vision.  The urethra was normal in caliber without stricture.  The prostate demonstrated mild lateral lobe enlargement and a normal bladder neck.  Panendoscopy was performed and the bladder mucosa showed no erythema, solid or papillary lesions.  Attention was directed to the right ureteral orifice and a 0.038 Sensor wire  was placed in the right ureteral orifice however could not be negotiated passed the stone.  The cystoscope was removed and a 4.5 Fr semirigid ureteroscope was then advanced into the distal ureter and the wire was able to be negotiated passed the stone without difficulty and advanced to the renal pelvis under fluoroscopic guidance.  The ureteroscope was removed and repassed alongside the wire.  The stone was visualized in the distal ureter and there was marked periureteral edema and inflammatory changes present.  The stone was then fragmented with a 273 micron holmium laser fiber on an initial setting of 0.2 J and frequency of 20 hz and the frequency subsequently increased to 30 Hz the second proximal calculus had migrated to the distal ureter and was fragmented in a similar fashion.  All stones were then removed from the ureter with a zero tip nitinol basket.  Reinspection of the ureter revealed no remaining visible stones or fragments.  Although no perforation of the ureter was noted there was significant inflammatory changes present from the impacted stone.  The semirigid ureteroscope could not be advanced above the mid ureter and a second guidewire was placed.  The semirigid scope was removed and a flexible ureteroscope was placed over the wire and advanced to the UPJ.  No additional ureteral calculi were noted on withdrawal of scope.  Retrograde pyelogram was performed with findings as described above.  The wire was then backloaded through the cystoscope and a 6 French/26 cm double-J ureteral stent was advance over the wire using Seldinger technique.  The wire was then removed with an adequate stent curl noted in the renal pelvis as well as in the bladder.  The bladder was then emptied and the procedure ended.  The patient appeared to tolerate  the procedure well and without complications.  After anesthetic reversal the patient was transported to the PACU in stable condition.   Plan: He will  return in 2 to 3 weeks for stent removal.   Riki AltesScott C Alaija Ruble, MD

## 2017-12-20 NOTE — Anesthesia Preprocedure Evaluation (Signed)
Anesthesia Evaluation  Patient identified by MRN, date of birth, ID band Patient awake    Reviewed: Allergy & Precautions, NPO status , Patient's Chart, lab work & pertinent test results, reviewed documented beta blocker date and time   Airway Mallampati: III  TM Distance: >3 FB     Dental  (+) Chipped   Pulmonary former smoker,           Cardiovascular      Neuro/Psych    GI/Hepatic   Endo/Other    Renal/GU Renal disease     Musculoskeletal   Abdominal   Peds  Hematology   Anesthesia Other Findings Obese.  Reproductive/Obstetrics                             Anesthesia Physical Anesthesia Plan  ASA: III  Anesthesia Plan: General   Post-op Pain Management:    Induction: Intravenous  PONV Risk Score and Plan:   Airway Management Planned: LMA  Additional Equipment:   Intra-op Plan:   Post-operative Plan:   Informed Consent: I have reviewed the patients History and Physical, chart, labs and discussed the procedure including the risks, benefits and alternatives for the proposed anesthesia with the patient or authorized representative who has indicated his/her understanding and acceptance.     Plan Discussed with: CRNA  Anesthesia Plan Comments:         Anesthesia Quick Evaluation  

## 2017-12-20 NOTE — Anesthesia Procedure Notes (Signed)
Procedure Name: LMA Insertion Date/Time: 12/20/2017 11:06 AM Performed by: Stormy Fabianurtis, Oland Arquette, CRNA Pre-anesthesia Checklist: Patient identified, Patient being monitored, Timeout performed, Emergency Drugs available and Suction available Patient Re-evaluated:Patient Re-evaluated prior to induction Oxygen Delivery Method: Circle system utilized Preoxygenation: Pre-oxygenation with 100% oxygen Induction Type: IV induction Ventilation: Mask ventilation without difficulty LMA: LMA inserted LMA Size: 5.0 Tube type: Oral Number of attempts: 1 Placement Confirmation: positive ETCO2 and breath sounds checked- equal and bilateral Tube secured with: Tape Dental Injury: Teeth and Oropharynx as per pre-operative assessment

## 2017-12-20 NOTE — Interval H&P Note (Signed)
History and Physical Interval Note:  12/20/2017 11:00 AM  Jon [name]  has presented today for surgery, with the diagnosis of right ureteral calculus  The various methods of treatment have been discussed with the patient and family. After consideration of risks, benefits and other options for treatment, the patient has consented to  Procedure(s): CYSTOSCOPY/URETEROSCOPY/HOLMIUM LASER/STENT PLACEMENT (Right) as a surgical intervention .  The patient's history has been reviewed, patient examined, no change in status, stable for surgery.  I have reviewed the patient's chart and labs.  Questions were answered to the patient's satisfaction.     Scott C Stoioff

## 2017-12-20 NOTE — Anesthesia Postprocedure Evaluation (Signed)
Anesthesia Post Note  Patient: Jon BuzzardRobert B Steffenhagen  Procedure(s) Performed: CYSTOSCOPY/URETEROSCOPY/HOLMIUM LASER/STENT PLACEMENT (Right Ureter)  Patient location during evaluation: PACU Anesthesia Type: General Level of consciousness: awake and alert Pain management: pain level controlled Vital Signs Assessment: post-procedure vital signs reviewed and stable Respiratory status: spontaneous breathing, nonlabored ventilation, respiratory function stable and patient connected to nasal cannula oxygen Cardiovascular status: blood pressure returned to baseline and stable Postop Assessment: no apparent nausea or vomiting Anesthetic complications: no     Last Vitals:  Vitals:   12/20/17 1250 12/20/17 1305  BP: (!) 135/96 127/87  Pulse: 94 93  Resp: 13 19  Temp: (!) 36.2 C   SpO2: 98% 96%    Last Pain:  Vitals:   12/20/17 1305  TempSrc:   PainSc: 0-No pain                 Khye Hochstetler S

## 2017-12-20 NOTE — Anesthesia Post-op Follow-up Note (Signed)
Anesthesia QCDR form completed.        

## 2017-12-21 LAB — CULTURE, URINE COMPREHENSIVE

## 2017-12-26 LAB — STONE ANALYSIS
CA PHOS CRY STONE QL IR: 30 %
Ca Oxalate,Dihydrate: 15 %
Ca Oxalate,Monohydr.: 55 %
STONE WEIGHT KSTONE: 19 mg

## 2017-12-27 ENCOUNTER — Ambulatory Visit: Payer: Managed Care, Other (non HMO) | Admitting: Urology

## 2018-01-06 ENCOUNTER — Telehealth: Payer: Self-pay | Admitting: Urology

## 2018-01-06 NOTE — Telephone Encounter (Signed)
Pt is having blood in urine when he's more active.  He wants to know if this is normal.  He is also having back pain on right side. Please call pt's wife Neysa BonitoChristy [phone]

## 2018-01-07 NOTE — Telephone Encounter (Signed)
Spoke with patient's wife and notified that this is normal with having stent in place this is normal and should resolve after stent is removed

## 2018-01-23 ENCOUNTER — Encounter: Payer: Self-pay | Admitting: Urology

## 2018-01-23 ENCOUNTER — Ambulatory Visit (INDEPENDENT_AMBULATORY_CARE_PROVIDER_SITE_OTHER): Payer: Managed Care, Other (non HMO) | Admitting: Urology

## 2018-01-23 VITALS — BP 123/79 | HR 80 | Temp 97.9°F | Resp 14 | Ht 70.0 in | Wt 250.0 lb

## 2018-01-23 DIAGNOSIS — Z09 Encounter for follow-up examination after completed treatment for conditions other than malignant neoplasm: Secondary | ICD-10-CM

## 2018-01-23 NOTE — Progress Notes (Signed)
Indications: Patient is 42 y.o., male status post ureteroscopic removal of right distal calculi on 12/20/2017.  He had no postoperative problems however his ureteral stent has not been removed.  He has moderate stent irritative symptoms.  He otherwise had no postoperative problems.  The patient is presenting today for stent removal.  Stone analysis was mixed calcium oxalate/calcium phosphate.  Procedure:  Flexible Cystoscopy with stent removal (75102)  Timeout was performed and the correct patient, procedure and participants were identified.    Description:  The patient was prepped and draped in the usual sterile fashion. Flexible cystosopy was performed.  The stent was visualized, grasped, and removed intact without difficulty. The patient tolerated the procedure well.  A single dose of oral antibiotics was given.  Complications:  None  Plan: He is a recurrent stone former.  Have recommended a metabolic evaluation to include blood work and a 24-hour urine study.

## 2018-02-06 ENCOUNTER — Other Ambulatory Visit: Payer: Self-pay

## 2018-02-06 MED ORDER — OXYBUTYNIN CHLORIDE 5 MG PO TABS: ORAL_TABLET | ORAL | 0 refills | Status: DC

## 2018-04-02 ENCOUNTER — Other Ambulatory Visit: Payer: Managed Care, Other (non HMO)

## 2018-05-01 ENCOUNTER — Ambulatory Visit (INDEPENDENT_AMBULATORY_CARE_PROVIDER_SITE_OTHER): Payer: 59 | Admitting: Urology

## 2018-05-01 ENCOUNTER — Encounter: Payer: Self-pay | Admitting: Urology

## 2018-05-01 VITALS — BP 124/85 | HR 89 | Ht 70.0 in | Wt 266.8 lb

## 2018-05-01 DIAGNOSIS — N200 Calculus of kidney: Secondary | ICD-10-CM

## 2018-05-01 DIAGNOSIS — R109 Unspecified abdominal pain: Secondary | ICD-10-CM | POA: Diagnosis not present

## 2018-05-01 NOTE — Progress Notes (Addendum)
05/01/2018 3:45 PM  Jon [name] Sep 12, 1975 [id number]  Referring provider: [facility] [address] [place], [place] [phone]  Chief Complaint  Patient presents with  . Follow-up   Urologic history: 1.  Bilateral nephrolithiasis - Right ureteroscopy/laser lithotripsy 7 mm and 11 mm distal calculi 12/20/2017 - Stone analysis 15% CaOxDi/55% CaOxMo/30% CaPhos - Bilateral nonobstructing renal calculi   HPI: Jon [name] is a 42 yo M who returns today for follow-up of nephrolithiasis and review of his metabolic evaluation.  He reports of pressure in the flank area that has been going on for several weeks and has been worsening gradually. He reports of the pain being similar to the previous stones but not as severe. Drinking water eases the pains.   24-hour urine study shows a good urine volume.  He has hypercalciuria and hyperoxaluria however his supersaturation number calcium oxalate and uric acid is within the normal range.  Serum calcium and uric acid were normal.      PMH: No past medical history on file.  Surgical History: Past Surgical History:  Procedure Laterality Date  . CHOLECYSTECTOMY  08/2017  . CYSTOSCOPY/URETEROSCOPY/HOLMIUM LASER/STENT PLACEMENT Right 12/20/2017   Procedure: CYSTOSCOPY/URETEROSCOPY/HOLMIUM LASER/STENT PLACEMENT;  Surgeon: Riki AltesStoioff, Scott C, MD;  Location: [facility] ORS;  Service: Urology;  Laterality: Right;  . EYE SURGERY      Home Medications:  Allergies as of 05/01/2018      Reactions   Vancomycin Itching      Medication List    as of May 01, 2018 11:59 PM   You have not been prescribed any medications.     Allergies:  Allergies  Allergen Reactions  . Vancomycin Itching    Family History: No family history on file.  Social History:  reports that he has quit smoking. He quit smokeless tobacco use about 5 months ago.  His smokeless tobacco use included snuff. He reports that he does not drink alcohol  or use drugs.  ROS: UROLOGY Frequent Urination?: No Hard to postpone urination?: No Burning/pain with urination?: No Get up at night to urinate?: No Leakage of urine?: No Urine [name] starts and stops?: No Trouble starting [name]?: No Do you have to strain to urinate?: No Blood in urine?: No Urinary tract infection?: No Sexually transmitted disease?: No Injury to kidneys or bladder?: No Painful intercourse?: No Weak [name]?: No Erection problems?: No Penile pain?: No  Gastrointestinal Nausea?: No Vomiting?: No Indigestion/heartburn?: No Diarrhea?: No Constipation?: No  Constitutional Fever: No Night sweats?: No Weight loss?: No Fatigue?: No  Skin Skin rash/lesions?: No Itching?: No  Eyes Blurred vision?: No Double vision?: No  Ears/Nose/Throat Sore throat?: No  Hematologic/Lymphatic Swollen glands?: No Easy bruising?: No  Cardiovascular Leg swelling?: No Chest pain?: No  Respiratory Cough?: No Shortness of breath?: No  Endocrine Excessive thirst?: No  Musculoskeletal Back pain?: No Joint pain?: No  Neurological Headaches?: No Dizziness?: No  Psychologic Depression?: No Anxiety?: No  Physical Exam: BP 124/85 (BP Location: Left Arm)   Pulse 89   Ht 5\' 10"  (1.778 m)   Wt 266 lb 12.8 oz (121 kg)   BMI 38.28 kg/m   Constitutional:  Alert and oriented, No acute distress. HEENT:  AT, moist mucus membranes.  Trachea midline, no masses. Cardiovascular: No clubbing, cyanosis, or edema. Respiratory: Normal respiratory effort, no increased work of breathing. Skin: No rashes, bruises or suspicious lesions. Neurologic: Grossly intact, no focal deficits, moving all 4 extremities. Psychiatric: Normal mood and affect   Assessment &  Plan:   1. Right nephrolithiasis 24 urine study does show hypercalciuria and hyperoxaluria however his supersaturations are normal.  He does not desire further medical management at this time and would like to  monitor.  He does have mild right flank pain.  A renal ultrasound and KUB was recommended.  He will be notified with results and further recommendations.   Riki Altes, MD  [facility] [address] [place], [place] [address] [phone]  I, Donne Hazel, am acting as a scribe for Dr. Lorin Picket C. Stoioff,  I, Riki Altes, MD, have reviewed all documentation for this visit. The documentation on 05/01/14 for the exam, diagnosis, procedures, and orders are all accurate and complete.

## 2018-05-04 ENCOUNTER — Encounter: Payer: Self-pay | Admitting: Urology

## 2018-05-06 ENCOUNTER — Ambulatory Visit
Admission: RE | Admit: 2018-05-06 | Discharge: 2018-05-06 | Disposition: A | Discharge status: Home / Self Care | Payer: Managed Care, Other (non HMO) | Source: Ambulatory Visit | Attending: Urology | Admitting: Urology

## 2018-05-06 DIAGNOSIS — R109 Unspecified abdominal pain: Secondary | ICD-10-CM | POA: Diagnosis not present

## 2018-05-06 DIAGNOSIS — N200 Calculus of kidney: Secondary | ICD-10-CM

## 2018-05-07 ENCOUNTER — Telehealth: Payer: Self-pay

## 2018-05-07 NOTE — Telephone Encounter (Signed)
Left pt mess to call 

## 2018-05-07 NOTE — Telephone Encounter (Signed)
-----   Message from Riki AltesScott C Stoioff, MD sent at 05/06/2018  8:28 PM EST ----- Renal ultrasound showed nonobstructing left lower pole calculi.  No hydronephrosis or evidence of a stone causing blockage/pain.

## 2018-05-07 NOTE — Telephone Encounter (Signed)
-----   Message from Riki AltesScott C Stoioff, MD sent at 05/06/2018  8:27 PM EST ----- KUB was reviewed and shows small, bilateral renal calculi.  No calcifications suspicious for ureteral stones causing pain are identified.  Will await renal ultrasound results.

## 2018-05-07 NOTE — Telephone Encounter (Signed)
Pt called office and I read message from Stoioff. °

## 2018-10-24 ENCOUNTER — Other Ambulatory Visit: Payer: Self-pay | Admitting: Urology

## 2018-10-24 DIAGNOSIS — N200 Calculus of kidney: Secondary | ICD-10-CM

## 2018-10-31 ENCOUNTER — Ambulatory Visit: Payer: 59 | Admitting: Urology

## 2019-03-06 ENCOUNTER — Ambulatory Visit
Admission: RE | Admit: 2019-03-06 | Discharge: 2019-03-06 | Disposition: A | Discharge status: Home / Self Care | Payer: Managed Care, Other (non HMO) | Source: Ambulatory Visit | Attending: Urology | Admitting: Urology

## 2019-03-06 ENCOUNTER — Encounter: Payer: Self-pay | Admitting: Urology

## 2019-03-06 ENCOUNTER — Ambulatory Visit (INDEPENDENT_AMBULATORY_CARE_PROVIDER_SITE_OTHER): Payer: 59 | Admitting: Urology

## 2019-03-06 ENCOUNTER — Other Ambulatory Visit: Payer: Self-pay

## 2019-03-06 VITALS — BP 156/92 | HR 89 | Ht 70.0 in | Wt 266.0 lb

## 2019-03-06 DIAGNOSIS — N200 Calculus of kidney: Secondary | ICD-10-CM | POA: Insufficient documentation

## 2019-03-06 NOTE — Progress Notes (Signed)
03/06/2019 3:17 PM   Jon [name] 04/09/1976 [id number]  Referring provider: Medicine, [facility] [address] Physical [facility] [place],  [place] [address]  Chief Complaint  Patient presents with  . Nephrolithiasis    Urologic history: 1.  Bilateral nephrolithiasis - Right ureteroscopy/laser lithotripsy 7 mm and 11 mm distal calculi 12/20/2017 - Stone analysis 15% CaOxDi/55% CaOxMo/30% CaPhos - Bilateral nonobstructing renal calculi -Metabolic evaluation with good urine volume.  Positive hypercalciuria/hyperoxaluria however supersaturation's were normal.  He declined medical management.   HPI: 43 y.o. male presents for routine follow-up of bilateral nephrolithiasis.  He has occasional twinges of right flank pain but no renal colic.  KUB performed today was reviewed and there are faint, bilateral renal calcifications which have not increased in size when compared with the previous KUB and CT.   PMH: No past medical history on file.  Surgical History: Past Surgical History:  Procedure Laterality Date  . CHOLECYSTECTOMY  08/2017  . CYSTOSCOPY/URETEROSCOPY/HOLMIUM LASER/STENT PLACEMENT Right 12/20/2017   Procedure: CYSTOSCOPY/URETEROSCOPY/HOLMIUM LASER/STENT PLACEMENT;  Surgeon: Riki Altes, MD;  Location: [facility] ORS;  Service: Urology;  Laterality: Right;  . EYE SURGERY      Home Medications:  Allergies as of 03/06/2019      Reactions   Vancomycin Itching      Medication List    as of March 06, 2019  3:17 PM   You have not been prescribed any medications.     Allergies:  Allergies  Allergen Reactions  . Vancomycin Itching    Family History: No family history on file.  Social History:  reports that he has quit smoking. He quit smokeless tobacco use about 15 months ago.  His smokeless tobacco use included snuff. He reports that he does not drink alcohol or use drugs.  ROS: UROLOGY Frequent Urination?: No Hard to postpone urination?: No  Burning/pain with urination?: No Get up at night to urinate?: No Leakage of urine?: No Urine stream starts and stops?: No Trouble starting stream?: No Do you have to strain to urinate?: No Blood in urine?: No Urinary tract infection?: No Sexually transmitted disease?: No Injury to kidneys or bladder?: No Painful intercourse?: No Weak stream?: No Erection problems?: No Penile pain?: No  Gastrointestinal Nausea?: No Vomiting?: No Indigestion/heartburn?: No Diarrhea?: No Constipation?: No  Constitutional Fever: No Night sweats?: No Weight loss?: No Fatigue?: No  Skin Skin rash/lesions?: No Itching?: No  Eyes Blurred vision?: No Double vision?: No  Ears/Nose/Throat Sore throat?: No Sinus problems?: No  Hematologic/Lymphatic Swollen glands?: No Easy bruising?: No  Cardiovascular Leg swelling?: No Chest pain?: No  Respiratory Cough?: No Shortness of breath?: No  Endocrine Excessive thirst?: No  Musculoskeletal Back pain?: No Joint pain?: No  Neurological Headaches?: No Dizziness?: No  Psychologic Depression?: No Anxiety?: No  Physical Exam: BP (!) 156/92 (BP Location: Left Arm, Patient Position: Sitting, Cuff Size: Normal)   Pulse 89   Ht 5\' 10"  (1.778 m)   Wt 266 lb (120.7 kg)   BMI 38.17 kg/m   Constitutional:  Alert and oriented, No acute distress. HEENT: [place] AT, moist mucus membranes.  Trachea midline, no masses. Cardiovascular: No clubbing, cyanosis, or edema. Respiratory: Normal respiratory effort, no increased work of breathing. GI: Abdomen is soft, nontender, nondistended, no abdominal masses GU: No CVA tenderness Lymph: No cervical or inguinal lymphadenopathy. Skin: No rashes, bruises or suspicious lesions. Neurologic: Grossly intact, no focal deficits, moving all 4 extremities. Psychiatric: Normal mood and affect.   Assessment & Plan:    -  Bilateral nephrolithiasis Stable and asymptomatic.  He desires to continue  observation.  Follow-up KUB/office visit 1 year and he was instructed to call earlier for development of renal colic.   Abbie Sons, [place] [address], [place], [place] [address] [phone]

## 2019-03-08 ENCOUNTER — Encounter: Payer: Self-pay | Admitting: Urology

## 2019-11-17 ENCOUNTER — Ambulatory Visit
Admission: RE | Admit: 2019-11-17 | Discharge: 2019-11-17 | Disposition: A | Discharge status: Home / Self Care | Payer: Managed Care, Other (non HMO) | Source: Ambulatory Visit | Attending: Emergency Medicine | Admitting: Emergency Medicine

## 2019-11-17 ENCOUNTER — Other Ambulatory Visit: Payer: Self-pay

## 2019-11-17 VITALS — BP 139/91 | HR 83 | Temp 98.4°F | Resp 18 | Ht 69.0 in | Wt 243.0 lb

## 2019-11-17 DIAGNOSIS — R0982 Postnasal drip: Secondary | ICD-10-CM

## 2019-11-17 DIAGNOSIS — K122 Cellulitis and abscess of mouth: Secondary | ICD-10-CM

## 2019-11-17 MED ORDER — DEXAMETHASONE SODIUM PHOSPHATE 10 MG/ML IJ SOLN
10.0000 mg | Freq: Once | INTRAMUSCULAR | Status: AC
  Administered 2019-11-17: 10 mg via INTRAMUSCULAR

## 2019-11-17 MED ORDER — FLUTICASONE PROPIONATE 50 MCG/ACT NA SUSP: 2.0000 | Freq: Every day | NASAL | 0 refills | Status: AC

## 2019-11-17 NOTE — ED Triage Notes (Signed)
Patient states yesterday he woke up with his throat and uvula swollen. Denies sore throat. He states he took Benadryl yesterday with no relief. Denies breathing difficulties.

## 2019-11-17 NOTE — ED Provider Notes (Signed)
HPI  SUBJECTIVE:  Jon [name] is a 44 y.o. male who presents with nasal drip starting 2 days ago.  States that he woke up gagging, had an episode of very acidic emesis which he states happens from time to time.  He states that he has a sensitive gag reflex.  He reports uvular and soft palate swelling the next morning, soreness described as feeling "raw", and a foreign body sensation in his throat.  States that he took 1 Benadryl every 4 hours all day yesterday, drank cold and hot liquids.  States that the swelling getting somewhat better today.  Denies any pain.  There are no aggravating or alleviating factors.  No fevers, preceding sore throat.  No sensation of throat swelling shut, other known trauma to the uvula.  No headaches.  He is not a smoker.  No neck stiffness, trismus, voice changes, tongue or lip swelling, wheezing, difficulty breathing, shortness of breath, abdominal pain, diarrhea, syncope.  He does not take any medications on a regular basis.  He has a past medical history of hypertension, but states that it is diet controlled.  He has a history of GERD, which he said resolved when his gallbladder was removed.  No history of diabetes.  YOV:ZCHYIFOY, Marya Amsler, MD   History reviewed. No pertinent past medical history.  Past Surgical History:  Procedure Laterality Date  . CHOLECYSTECTOMY  08/2017  . CYSTOSCOPY/URETEROSCOPY/HOLMIUM LASER/STENT PLACEMENT Right 12/20/2017   Procedure: CYSTOSCOPY/URETEROSCOPY/HOLMIUM LASER/STENT PLACEMENT;  Surgeon: Riki Altes, MD;  Location: [facility] ORS;  Service: Urology;  Laterality: Right;  . EYE SURGERY      History reviewed. No pertinent family history.  Social History   Tobacco Use  . Smoking status: Former Games developer  . Smokeless tobacco: Former Neurosurgeon    Types: Snuff    Quit date: 11/18/2017  Vaping Use  . Vaping Use: Never used  Substance Use Topics  . Alcohol use: No  . Drug use: Never    No current facility-administered medications  for this encounter.  Current Outpatient Medications:  .  fluticasone (FLONASE) 50 MCG/ACT nasal spray, Place 2 sprays into both nostrils daily., Disp: 16 g, Rfl: 0  Allergies  Allergen Reactions  . Vancomycin Itching     ROS  As noted in HPI.   Physical Exam  BP (!) 139/91 (BP Location: Right Arm)   Pulse 83   Temp 98.4 F (36.9 C) (Oral)   Resp 18   Ht 5\' 9"  (1.753 m)   Wt 110.2 kg   SpO2 98%   BMI 35.88 kg/m   Constitutional: Well developed, well nourished, no acute distress Eyes:  EOMI, conjunctiva normal bilaterally HENT: Normocephalic, atraumatic,mucus membranes moist no angioedema of the lips or tongue.  Uvula slightly swollen,  enlarged, erythematous, nontender.  Uvula midline.  Soft palate appears normal.  Tonsils normal size without exudates. Respiratory: Normal inspiratory effort Cardiovascular: Normal rate GI: nondistended skin: No rash, skin intact Musculoskeletal: no deformities Neurologic: Alert & oriented x 3, no focal neuro deficits Psychiatric: Speech and behavior appropriate   ED Course   Medications  dexamethasone (DECADRON) injection 10 mg (10 mg Intramuscular Given 11/17/19 1341)    No orders of the defined types were placed in this encounter.   No results found for this or any previous visit (from the past 24 hour(s)). No results found.  ED Clinical Impression  1. Uvulitis   2. Postnasal drip      ED Assessment/Plan  Suspect angioedema of the uvula  from vomiting up acidic material.  Doubt infectious cause such as peritonsillar abscess, retropharyngeal abscess, epiglottitis, strep pharyngitis.  Gave patient dexamethasone 10 mg IM x1.  Discontinue Benadryl.  Start  Claritin or Zyrtec.  Cold drinks.  Flonase for the postnasal drip follow-up with Dr. Jenne Campus, ENT, if this persists.  Discussed  MDM, treatment plan, and plan for follow-up with patient. Discussed sn/sx that should prompt return to the ED. patient agrees with plan.   Meds  ordered this encounter  Medications  . dexamethasone (DECADRON) injection 10 mg  . fluticasone (FLONASE) 50 MCG/ACT nasal spray    Sig: Place 2 sprays into both nostrils daily.    Dispense:  16 g    Refill:  0    *This clinic note was created using Scientist, clinical (histocompatibility and immunogenetics). Therefore, there may be occasional mistakes despite careful proofreading.   ?    Domenick Gong, MD 11/17/19 1434

## 2019-11-17 NOTE — Discharge Instructions (Addendum)
Try some saline nasal irrigation with a Lloyd Huger med rinse and distilled water as often as you want to help reduce the postnasal drip.  The Flonase may take several days for it to have its full effect, so be patient with it.  try Claritin or Zyrtec rather than the Benadryl.  The dexamethasone will last for 3 days.  Cold drinks.

## 2020-02-16 ENCOUNTER — Other Ambulatory Visit: Payer: Self-pay | Admitting: *Deleted

## 2020-02-16 DIAGNOSIS — N200 Calculus of kidney: Secondary | ICD-10-CM

## 2020-03-07 ENCOUNTER — Ambulatory Visit: Payer: 59 | Admitting: Urology

## 2020-03-10 NOTE — Progress Notes (Signed)
   03/11/2020 9:20 AM   Jon [name] February 28, 1976 [id number]  Referring provider: Lauro Regulus, MD [address] [facility] [facility] [place] [facility] [place],  [place] [address] Chief Complaint  Patient presents with  . Nephrolithiasis   Urologic history: 1.Bilateral nephrolithiasis -Right ureteroscopy/laser lithotripsy 7 mm and 11 mm distal calculi 12/20/2017 -Stone analysis15%CaOxDi/55%CaOxMo/30%CaPhos -Bilateral nonobstructing renal calculi -Metabolic evaluation with good urine volume.  Positive hypercalciuria/hyperoxaluria however supersaturation's were normal.  He declined medical management.  HPI: Jon [name] is a 44 y.o. male who presents today for a 1 year follow up of bilateral nephrolithiasis.   -Since last year the patient has been doing well. -Patient reports chronic dull right flank pain that does not feel like kidney stone pain -KUB today is unchanged from imaging on 02/2019.  Small, bilateral renal calculi L >R  PMH: History reviewed. No pertinent past medical history.  Surgical History: Past Surgical History:  Procedure Laterality Date  . CHOLECYSTECTOMY  08/2017  . CYSTOSCOPY/URETEROSCOPY/HOLMIUM LASER/STENT PLACEMENT Right 12/20/2017   Procedure: CYSTOSCOPY/URETEROSCOPY/HOLMIUM LASER/STENT PLACEMENT;  Surgeon: Riki Altes, MD;  Location: [facility] ORS;  Service: Urology;  Laterality: Right;  . EYE SURGERY      Home Medications:  Allergies as of 03/11/2020      Reactions   Vancomycin Itching      Medication List       Accurate as of March 11, 2020  9:20 AM. If you have any questions, ask your nurse or doctor.        fluticasone 50 MCG/ACT nasal spray Commonly known as: FLONASE Place 2 sprays into both nostrils daily.       Allergies:  Allergies  Allergen Reactions  . Vancomycin Itching    Family History: History reviewed. No pertinent family history.  Social History:  reports that he has quit smoking. He quit smokeless  tobacco use about 2 years ago.  His smokeless tobacco use included snuff. He reports that he does not drink alcohol and does not use drugs.   Physical Exam: BP 139/90   Pulse 70   Ht 5\' 9"  (1.753 m)   Wt 250 lb (113.4 kg)   BMI 36.92 kg/m   Constitutional:  Alert and oriented, No acute distress. HEENT: [place] AT, moist mucus membranes.  Trachea midline, no masses. Cardiovascular: No clubbing, cyanosis, or edema. Respiratory: Normal respiratory effort, no increased work of breathing. Skin: No rashes, bruises or suspicious lesions. Neurologic: Grossly intact, no focal deficits, moving all 4 extremities. Psychiatric: Normal mood and affect.  Laboratory Data:  Pertinent Imaging: KUB today personally reviewed   Assessment & Plan:    1. Bilateral nephrolithiasis  Asymptomatic  KUB today is stable and unchanged from 02/2019.   We discussed general stone prevention techniques including drinking plenty water with goal of producing 2.5 L urine daily, increased citric acid intake, avoidance of high oxalate containing foods, and decreased salt intake.  Information about dietary recommendations given today.   Follow up in 1 year with KUB.    [facility] [address], [address] [place], [place] [phone]  I, [phone], am acting as a scribe for Dr. Theador Hawthorne C. Ieshia Hatcher,  I have reviewed the above documentation for accuracy and completeness, and I agree with the above.   Lorin Picket, MD

## 2020-03-11 ENCOUNTER — Ambulatory Visit
Admission: RE | Admit: 2020-03-11 | Discharge: 2020-03-11 | Disposition: A | Discharge status: Home / Self Care | Payer: 59 | Source: Ambulatory Visit | Attending: Urology | Admitting: Urology

## 2020-03-11 ENCOUNTER — Other Ambulatory Visit: Payer: Self-pay

## 2020-03-11 ENCOUNTER — Ambulatory Visit
Admission: RE | Admit: 2020-03-11 | Discharge: 2020-03-11 | Disposition: A | Discharge status: Home / Self Care | Payer: 59 | Attending: Urology | Admitting: Urology

## 2020-03-11 ENCOUNTER — Ambulatory Visit (INDEPENDENT_AMBULATORY_CARE_PROVIDER_SITE_OTHER): Payer: 59 | Admitting: Urology

## 2020-03-11 ENCOUNTER — Encounter: Payer: Self-pay | Admitting: Urology

## 2020-03-11 VITALS — BP 139/90 | HR 70 | Ht 69.0 in | Wt 250.0 lb

## 2020-03-11 DIAGNOSIS — N200 Calculus of kidney: Secondary | ICD-10-CM | POA: Insufficient documentation

## 2021-01-06 ENCOUNTER — Encounter: Payer: Self-pay | Admitting: Emergency Medicine

## 2021-01-06 ENCOUNTER — Ambulatory Visit
Admission: EM | Admit: 2021-01-06 | Discharge: 2021-01-06 | Disposition: A | Discharge status: Home / Self Care | Payer: 59 | Attending: Sports Medicine | Admitting: Sports Medicine

## 2021-01-06 ENCOUNTER — Other Ambulatory Visit: Payer: Self-pay

## 2021-01-06 DIAGNOSIS — K219 Gastro-esophageal reflux disease without esophagitis: Secondary | ICD-10-CM | POA: Diagnosis not present

## 2021-01-06 DIAGNOSIS — R079 Chest pain, unspecified: Secondary | ICD-10-CM

## 2021-01-06 DIAGNOSIS — R0789 Other chest pain: Secondary | ICD-10-CM | POA: Insufficient documentation

## 2021-01-06 LAB — CBC WITH DIFFERENTIAL/PLATELET
Abs Immature Granulocytes: 0.04 10*3/uL (ref 0.00–0.07)
Basophils Absolute: 0.1 10*3/uL (ref 0.0–0.1)
Basophils Relative: 1 %
Eosinophils Absolute: 0.1 10*3/uL (ref 0.0–0.5)
Eosinophils Relative: 1 %
HCT: 49.7 % (ref 39.0–52.0)
Hemoglobin: 17.5 g/dL — ABNORMAL HIGH (ref 13.0–17.0)
Immature Granulocytes: 0 %
Lymphocytes Relative: 18 %
Lymphs Abs: 1.9 10*3/uL (ref 0.7–4.0)
MCH: 29.9 pg (ref 26.0–34.0)
MCHC: 35.2 g/dL (ref 30.0–36.0)
MCV: 85 fL (ref 80.0–100.0)
Monocytes Absolute: 0.7 10*3/uL (ref 0.1–1.0)
Monocytes Relative: 6 %
Neutro Abs: 7.6 10*3/uL (ref 1.7–7.7)
Neutrophils Relative %: 74 %
Platelets: 283 10*3/uL (ref 150–400)
RBC: 5.85 MIL/uL — ABNORMAL HIGH (ref 4.22–5.81)
RDW: 12.7 % (ref 11.5–15.5)
WBC: 10.3 10*3/uL (ref 4.0–10.5)
nRBC: 0 % (ref 0.0–0.2)

## 2021-01-06 LAB — BASIC METABOLIC PANEL
Anion gap: 10 (ref 5–15)
BUN: 20 mg/dL (ref 6–20)
CO2: 21 mmol/L — ABNORMAL LOW (ref 22–32)
Calcium: 9.8 mg/dL (ref 8.9–10.3)
Chloride: 106 mmol/L (ref 98–111)
Creatinine, Ser: 1.04 mg/dL (ref 0.61–1.24)
GFR, Estimated: >60 mL/min (ref >60)
Glucose, Bld: 98 mg/dL (ref 70–99)
Potassium: 4.5 mmol/L (ref 3.5–5.1)
Sodium: 137 mmol/L (ref 135–145)

## 2021-01-06 LAB — TROPONIN I (HIGH SENSITIVITY): Troponin I (High Sensitivity): 2 ng/L (ref <18)

## 2021-01-06 MED ORDER — ESOMEPRAZOLE MAGNESIUM 20 MG PO PACK: 20.0000 mg | PACK | Freq: Every day | ORAL | 0 refills | Status: AC

## 2021-01-06 MED ORDER — FAMOTIDINE 20 MG PO TABS: 20.0000 mg | ORAL_TABLET | Freq: Two times a day (BID) | ORAL | 0 refills | Status: AC

## 2021-01-06 NOTE — ED Triage Notes (Signed)
Patient c/o chest pain off and on for the past 2 weeks.  Patient also states that his blood pressure has been elevated.  Patient states that he is not on BP medicine.  Patient states that he does not have a PCP currently.  Patient denies difficulty breathing.

## 2021-01-06 NOTE — Discharge Instructions (Addendum)
As we discussed, your labs are very reassuring.  That said, if your symptoms were to worsen then I want you go to the ER for higher level of care.  You should also call [phone] if they do worsen. We will do a trial of Pepcid until the Nexium starts working again to see whether or not this is secondary to reflux. I have also given you some educational handouts. I have also given you the name of a cardiologist and I have asked you to go ahead and give them a call and be seen to make sure that there is not an underlying cardiac issue that is causing your symptoms.  They can do a thorough work-up that I cannot do in the urgent care.

## 2021-01-06 NOTE — ED Provider Notes (Signed)
[facility]    CSN: [id number] Arrival date & time: 01/06/21  1253      History   Chief Complaint Chief Complaint  Patient presents with   Chest Pain    HPI Jon [name] is a 45 y.o. male.   45 year old male who presents for evaluation of central chest discomfort and tightness for the last few weeks.  He reports its been off and on.  Its more of a dull crampy type pain.  It is constant and really does not go away.  If he gets dizzy he really does not notice it.  He recently bought a new mattress and wonders whether or not this is something to do with it.  He denies any accidents trauma or falls.  He says it feels like "a bruise".  Does not change with deep breaths and no pleuritic chest pain.  He denies any cough, shortness of breath, URI symptoms.  No paresthesia in the arms.  No jaw pain.  No intermittent claudication.  No sense of impending doom.  He has no diaphoresis.  He has a physical job and works in [employer], [employer] and it really does not change when he is working although he does report that he does get a little more fatigued than he normally does.  He has no major cardiac issues.  He does have some borderline hypertension but is not on any medicines.  He also has some borderline diabetes but has not been initiated on medicine.  He denies any hypercholesterolemia.  He has had some issues with reflux in the past and was put on a PPI but since he had his gallbladder out has not needed his PPI.  He denies any abdominal or urinary symptoms at the present time.  Nothing really makes it worse, nothing really relieves it.   History reviewed. No pertinent past medical history.  Patient Active Problem List   Diagnosis Date Noted   Nephrolithiasis 12/19/2017   Biliary colic 07/11/2017    Past Surgical History:  Procedure Laterality Date   CHOLECYSTECTOMY  08/2017   CYSTOSCOPY/URETEROSCOPY/HOLMIUM LASER/STENT PLACEMENT Right 12/20/2017   Procedure:  CYSTOSCOPY/URETEROSCOPY/HOLMIUM LASER/STENT PLACEMENT;  Surgeon: Riki AltesStoioff, Scott C, MD;  Location: [facility] ORS;  Service: Urology;  Laterality: Right;   EYE SURGERY         Home Medications    Prior to Admission medications   Medication Sig Start Date End Date Taking? Authorizing Provider  esomeprazole (NEXIUM) 20 MG packet Take 20 mg by mouth daily before breakfast. 01/06/21  Yes Delton SeeBarnes, Davonda Ausley, MD  famotidine (PEPCID) 20 MG tablet Take 1 tablet (20 mg total) by mouth 2 (two) times daily. 01/06/21  Yes Delton SeeBarnes, Yarexi Pawlicki, MD  fluticasone [facility](FLONASE) 50 MCG/ACT nasal spray Place 2 sprays into both nostrils daily. 11/17/19   Domenick GongMortenson, Ashley, MD    Family History History reviewed. No pertinent family history.  Social History Social History   Tobacco Use   Smoking status: Former   Smokeless tobacco: Former    Types: Snuff    Quit date: 11/18/2017  Vaping Use   Vaping Use: Never used  Substance Use Topics   Alcohol use: No   Drug use: Never     Allergies   Vancomycin   Review of Systems Review of Systems  Constitutional:  Negative for activity change, appetite change, chills, diaphoresis, fatigue and fever.  HENT:  Negative for congestion, ear pain, postnasal drip, rhinorrhea, sinus pressure, sinus pain, sneezing and sore throat.   Eyes:  Negative  for pain.  Respiratory:  Positive for chest tightness. Negative for cough, shortness of breath and wheezing.   Cardiovascular:  Negative for chest pain and palpitations.  Gastrointestinal:  Negative for abdominal pain, diarrhea, nausea and vomiting.  Genitourinary:  Negative for dysuria.  Musculoskeletal:  Negative for back pain, myalgias and neck pain.  Skin:  Negative for color change, pallor, rash and wound.  Neurological:  Negative for dizziness, syncope, weakness, light-headedness, numbness and headaches.  All other systems reviewed and are negative.   Physical Exam Triage Vital Signs ED Triage Vitals  Enc Vitals Group     BP  01/06/21 1302 (!) 117/93     Pulse Rate 01/06/21 1302 96     Resp 01/06/21 1302 16     Temp 01/06/21 1302 98.6 F (37 C)     Temp Source 01/06/21 1302 Oral     SpO2 01/06/21 1302 98 %     Weight 01/06/21 1259 250 lb (113.4 kg)     Height 01/06/21 1259 5\' 9"  (1.753 m)     Head Circumference --      Peak Flow --      Pain Score 01/06/21 1259 2     Pain Loc --      Pain Edu? --      Excl. in GC? --    No data found.  Updated Vital Signs BP (!) 117/93 (BP Location: Left Arm)   Pulse 96   Temp 98.6 F (37 C) (Oral)   Resp 16   Ht 5\' 9"  (1.753 m)   Wt 113.4 kg   SpO2 98%   BMI 36.92 kg/m   Visual Acuity Right Eye Distance:   Left Eye Distance:   Bilateral Distance:    Right Eye Near:   Left Eye Near:    Bilateral Near:     Physical Exam Vitals and nursing note reviewed.  Constitutional:      General: He is not in acute distress.    Appearance: Normal appearance. He is well-developed. He is obese. He is not ill-appearing, toxic-appearing or diaphoretic.  HENT:     Head: Normocephalic and atraumatic.     Nose: Nose normal.     Mouth/Throat:     Mouth: Mucous membranes are moist.  Eyes:     Extraocular Movements: Extraocular movements intact.     Conjunctiva/sclera: Conjunctivae normal.     Pupils: Pupils are equal, round, and reactive to light.  Neck:     Vascular: No JVD.  Cardiovascular:     Rate and Rhythm: Normal rate and regular rhythm. No extrasystoles are present.    Chest Wall: PMI is not displaced.     Pulses: Normal pulses.          Carotid pulses are 2+ on the right side and 2+ on the left side.      Radial pulses are 2+ on the right side and 2+ on the left side.       Dorsalis pedis pulses are 2+ on the right side and 2+ on the left side.       Posterior tibial pulses are 2+ on the right side and 2+ on the left side.     Heart sounds: Normal heart sounds. Heart sounds not distant. No murmur heard. No systolic murmur is present.  No diastolic murmur  is present.    No friction rub. No gallop. No S3 or S4 sounds.  Pulmonary:     Effort: Pulmonary effort is normal.  Breath sounds: Normal breath sounds. No stridor. No decreased breath sounds, wheezing, rhonchi or rales.  Musculoskeletal:     Cervical back: Normal range of motion and neck supple.  Skin:    General: Skin is warm and dry.     Capillary Refill: Capillary refill takes less than 2 seconds.     Findings: No ecchymosis, erythema or rash.  Neurological:     General: No focal deficit present.     Mental Status: He is alert and oriented to person, place, and time.     UC Treatments / Results  Labs (all labs ordered are listed, but only abnormal results are displayed) Labs Reviewed  CBC WITH DIFFERENTIAL/PLATELET - Abnormal; Notable for the following components:      Result Value   RBC 5.85 (*)    Hemoglobin 17.5 (*)    All other components within normal limits  BASIC METABOLIC PANEL - Abnormal; Notable for the following components:   CO2 21 (*)    All other components within normal limits  TROPONIN I (HIGH SENSITIVITY)    EKG Twelve-lead EKG ordered interpreted by myself on January 06, 2021 at 1309 p.m.  Normal sinus rhythm with a ventricular rate of 87 bpm.  No acute ST or T wave changes appreciated.  PR interval and QRS duration within normal limits.  QT and QTc corrected also within normal limits.  No axis deviation.  No acute findings.  Radiology No results found.  Procedures Procedures (including critical care time)  Medications Ordered in UC Medications - No data to display  Initial Impression / Assessment and Plan / UC Course  I have reviewed the triage vital signs and the nursing notes.  Pertinent labs & imaging results that were available during my care of the patient were reviewed by me and considered in my medical decision making (see chart for details).  Clinical impression: 1.  Nonspecific chest pain 2.  History of GERD without esophagitis and  not currently on meds  Treatment plan: 1.  The findings and treatment plan were discussed in detail with the patient.  Patient was in agreement. 2.  Recommended we get a few labs.  CBC showed a mild elevation in hemoglobin at 17.5.  Normal hematocrit.  Basic metabolic panel showed a mild decrease in CO2 at 21.  Troponin was within normal limits with a value of 2.  Also recommended getting an EKG and the interpretation is above. 3.  Educational handouts provided. 4.  I recommended doing a trial of Pepcid until the Nexium kicks back in.  I gave him a prescription for both medicines. 5.  Also gave him the name of a local cardiologist for him to call and be seen.  There are test that they can do that I cannot do in the urgent care just to rule out an underlying cardiac issue. 6.  We had a long discussion that if symptoms were to worsen or not improve that he should go to the ER for higher level of care.  He voiced verbal understanding and was in agreement. 7.  He was discharged in stable condition and will follow-up here as needed.    Final Clinical Impressions(s) / UC Diagnoses   Final diagnoses:  Atypical chest pain  Gastroesophageal reflux disease without esophagitis  Nonspecific chest pain     Discharge Instructions      As we discussed, your labs are very reassuring.  That said, if your symptoms were to worsen then I want you go  to the ER for higher level of care.  You should also call 911 if they do worsen. We will do a trial of Pepcid until the Nexium starts working again to see whether or not this is secondary to reflux. I have also given you some educational handouts. I have also given you the name of a cardiologist and I have asked you to go ahead and give them a call and be seen to make sure that there is not an underlying cardiac issue that is causing your symptoms.  They can do a thorough work-up that I cannot do in the urgent care.     ED Prescriptions     Medication Sig  Dispense Auth. Provider   famotidine (PEPCID) 20 MG tablet Take 1 tablet (20 mg total) by mouth 2 (two) times daily. 30 tablet Delton See, MD   esomeprazole (NEXIUM) 20 MG packet Take 20 mg by mouth daily before breakfast. 30 each Delton See, MD      PDMP not reviewed this encounter.   Delton See, MD 01/06/21 1450

## 2021-03-14 ENCOUNTER — Other Ambulatory Visit: Payer: Self-pay | Admitting: *Deleted

## 2021-03-14 DIAGNOSIS — N200 Calculus of kidney: Secondary | ICD-10-CM

## 2021-03-15 ENCOUNTER — Ambulatory Visit
Admission: RE | Admit: 2021-03-15 | Discharge: 2021-03-15 | Disposition: A | Discharge status: Home / Self Care | Payer: 59 | Attending: Urology | Admitting: Urology

## 2021-03-15 ENCOUNTER — Other Ambulatory Visit: Payer: Self-pay

## 2021-03-15 ENCOUNTER — Ambulatory Visit
Admission: RE | Admit: 2021-03-15 | Discharge: 2021-03-15 | Disposition: A | Discharge status: Home / Self Care | Payer: 59 | Source: Ambulatory Visit | Attending: Urology | Admitting: Urology

## 2021-03-15 ENCOUNTER — Encounter: Payer: Self-pay | Admitting: Urology

## 2021-03-15 ENCOUNTER — Ambulatory Visit (INDEPENDENT_AMBULATORY_CARE_PROVIDER_SITE_OTHER): Payer: 59 | Admitting: Urology

## 2021-03-15 VITALS — BP 142/86 | HR 72 | Ht 69.0 in | Wt 250.0 lb

## 2021-03-15 DIAGNOSIS — N201 Calculus of ureter: Secondary | ICD-10-CM | POA: Diagnosis not present

## 2021-03-15 DIAGNOSIS — N200 Calculus of kidney: Secondary | ICD-10-CM

## 2021-03-15 NOTE — H&P (View-Only) (Signed)
   03/15/2021 9:01 AM   Jon [name] 11/22/1975 [id number]  Referring provider: Anderson, Marshall W, MD [address] [facility] [place],  [place] [address]  Chief Complaint  Patient presents with   Nephrolithiasis    Urologic history: 1.  Bilateral nephrolithiasis - Right ureteroscopy/laser lithotripsy 7 mm and 11 mm distal calculi 12/20/2017 - Stone analysis 15% CaOxDi/55% CaOxMo/30% CaPhos - Bilateral nonobstructing renal calculi -Metabolic evaluation with good urine volume.  Positive hypercalciuria/hyperoxaluria however supersaturation's were normal.  He declined medical management.  HPI: 45 y.o. male presents for annual follow-up.  Intermittent dull right back pain which she was experiencing last year No bothersome LUTS Denies dysuria, gross hematuria Denies flank, abdominal or pelvic pain KUB performed today was reviewed.  There is a calcification measuring ~ 5 mm over the left sacral ala which was not present on KUB from last year.  Left renal calcifications seen on last year's KUB are not visualized on today's x-ray.  Small calcification overlying the inferior portion of the right renal outline   PMH: History reviewed. No pertinent past medical history.  Surgical History: Past Surgical History:  Procedure Laterality Date   CHOLECYSTECTOMY  08/2017   CYSTOSCOPY/URETEROSCOPY/HOLMIUM LASER/STENT PLACEMENT Right 12/20/2017   Procedure: CYSTOSCOPY/URETEROSCOPY/HOLMIUM LASER/STENT PLACEMENT;  Surgeon: Chavonne Sforza C, MD;  Location: [facility] ORS;  Service: Urology;  Laterality: Right;   EYE SURGERY      Home Medications:  Allergies as of 03/15/2021       Reactions   Vancomycin Itching        Medication List        Accurate as of March 15, 2021  9:01 AM. If you have any questions, ask your nurse or doctor.          esomeprazole 20 MG packet Commonly known as: NexIUM Take 20 mg by mouth daily before breakfast.   famotidine 20 MG  tablet Commonly known as: PEPCID Take 1 tablet (20 mg total) by mouth 2 (two) times daily.   fluticasone 50 MCG/ACT nasal spray Commonly known as: FLONASE Place 2 sprays into both nostrils daily.        Allergies:  Allergies  Allergen Reactions   Vancomycin Itching    Family History: History reviewed. No pertinent family history.  Social History:  reports that he has quit smoking. He quit smokeless tobacco use about 3 years ago.  His smokeless tobacco use included snuff. He reports that he does not drink alcohol and does not use drugs.   Physical Exam: BP (!) 142/86   Pulse 72   Ht 5' 9" (1.753 m)   Wt 250 lb (113.4 kg)   BMI 36.92 kg/m   Constitutional:  Alert and oriented, No acute distress. HEENT: [place] AT, moist mucus membranes.  Trachea midline, no masses. Cardiovascular: No clubbing, cyanosis, or edema. Respiratory: Normal respiratory effort, no increased work of breathing. Psychiatric: Normal mood and affect.    Pertinent Imaging: KUB images October 2021 and October 2022 were personally reviewed and interpreted   Assessment & Plan:    1.  Possible left ureteral calculus New finding Recommend further evaluation with stone protocol CT  2.  Nephrolithiasis Bilateral nephrolithiasis   Ayauna Mcnay C Jaklyn Alen, MD  [facility] [address], [address] ,  [address] [phone]   

## 2021-03-15 NOTE — Progress Notes (Signed)
   03/15/2021 9:01 AM   Jon [name] 27-Sep-1975 [id number]  Referring provider: Lauro Regulus, MD [address] [facility] [place] [address] [place],  [place] [address]  Chief Complaint  Patient presents with   Nephrolithiasis    Urologic history: 1.  Bilateral nephrolithiasis - Right ureteroscopy/laser lithotripsy 7 mm and 11 mm distal calculi 12/20/2017 - Stone analysis 15% CaOxDi/55% CaOxMo/30% CaPhos - Bilateral nonobstructing renal calculi -Metabolic evaluation with good urine volume.  Positive hypercalciuria/hyperoxaluria however supersaturation's were normal.  He declined medical management.  HPI: 45 y.o. male presents for annual follow-up.  Intermittent dull right back pain which she was experiencing last year No bothersome LUTS Denies dysuria, gross hematuria Denies flank, abdominal or pelvic pain KUB performed today was reviewed.  There is a calcification measuring ~ 5 mm over the left sacral ala which was not present on KUB from last year.  Left renal calcifications seen on last year's KUB are not visualized on today's x-ray.  Small calcification overlying the inferior portion of the right renal outline   PMH: History reviewed. No pertinent past medical history.  Surgical History: Past Surgical History:  Procedure Laterality Date   CHOLECYSTECTOMY  08/2017   CYSTOSCOPY/URETEROSCOPY/HOLMIUM LASER/STENT PLACEMENT Right 12/20/2017   Procedure: CYSTOSCOPY/URETEROSCOPY/HOLMIUM LASER/STENT PLACEMENT;  Surgeon: Riki Altes, MD;  Location: [facility] ORS;  Service: Urology;  Laterality: Right;   EYE SURGERY      Home Medications:  Allergies as of 03/15/2021       Reactions   Vancomycin Itching        Medication List        Accurate as of March 15, 2021  9:01 AM. If you have any questions, ask your nurse or doctor.          esomeprazole 20 MG packet Commonly known as: NexIUM Take 20 mg by mouth daily before breakfast.   famotidine 20 MG  tablet Commonly known as: PEPCID Take 1 tablet (20 mg total) by mouth 2 (two) times daily.   fluticasone 50 MCG/ACT nasal spray Commonly known as: FLONASE Place 2 sprays into both nostrils daily.        Allergies:  Allergies  Allergen Reactions   Vancomycin Itching    Family History: History reviewed. No pertinent family history.  Social History:  reports that he has quit smoking. He quit smokeless tobacco use about 3 years ago.  His smokeless tobacco use included snuff. He reports that he does not drink alcohol and does not use drugs.   Physical Exam: BP (!) 142/86   Pulse 72   Ht 5\' 9"  (1.753 m)   Wt 250 lb (113.4 kg)   BMI 36.92 kg/m   Constitutional:  Alert and oriented, No acute distress. HEENT: [place] AT, moist mucus membranes.  Trachea midline, no masses. Cardiovascular: No clubbing, cyanosis, or edema. Respiratory: Normal respiratory effort, no increased work of breathing. Psychiatric: Normal mood and affect.    Pertinent Imaging: KUB images October 2021 and October 2022 were personally reviewed and interpreted   Assessment & Plan:    1.  Possible left ureteral calculus New finding Recommend further evaluation with stone protocol CT  2.  Nephrolithiasis Bilateral nephrolithiasis   November 2022, MD  [facility] [address], [address] [place], [place] [phone]

## 2021-04-03 ENCOUNTER — Ambulatory Visit
Admission: RE | Admit: 2021-04-03 | Discharge: 2021-04-03 | Disposition: A | Discharge status: Home / Self Care | Payer: 59 | Source: Ambulatory Visit | Attending: Urology | Admitting: Urology

## 2021-04-03 ENCOUNTER — Other Ambulatory Visit: Payer: Self-pay

## 2021-04-03 DIAGNOSIS — N200 Calculus of kidney: Secondary | ICD-10-CM | POA: Diagnosis not present

## 2021-04-03 DIAGNOSIS — N201 Calculus of ureter: Secondary | ICD-10-CM

## 2021-04-06 ENCOUNTER — Telehealth: Payer: Self-pay | Admitting: *Deleted

## 2021-04-06 NOTE — Telephone Encounter (Signed)
Notified patient as instructed, patient pleased. Discussed follow-up appointments, patient agrees.  Patient would like  shockwave lithotripsy.

## 2021-04-06 NOTE — Telephone Encounter (Signed)
-----   Message from Riki Altes, MD sent at 04/05/2021  7:01 AM EST ----- CT does show a 4 mm stone in the left lower ureter.  Options: Trial of passage, ureteroscopic removal or shockwave lithotripsy.  If he does desire trial of passage would recommend a repeat KUB 2 weeks

## 2021-04-11 ENCOUNTER — Other Ambulatory Visit: Payer: Self-pay | Admitting: Urology

## 2021-04-11 DIAGNOSIS — N201 Calculus of ureter: Secondary | ICD-10-CM

## 2021-04-11 MED ORDER — CEPHALEXIN 250 MG PO CAPS: 500.0000 mg | ORAL_CAPSULE | ORAL | Status: AC

## 2021-04-11 NOTE — Progress Notes (Unsigned)
ESWL ORDER FORM  Expected date of procedure: Patient preference  Surgeon: Whoever  Post op standing: 2-4wk follow up w/ PA, KUB prior  Anticoagulation/Aspirin/NSAID standing order: Hold all 72 hours prior  Anesthesia standing order: MAC  VTE standing: SCD's  Dx: Left Ureteral Stone  Procedure: left Extracorporeal shock wave lithotripsy  CPT : 16109  Standing Order Set:   *NPO after mn, KUB  *NS 160m/hr, Keflex 506mPO, Benadryl 2562mO, Valium 41m60m, Zofran 4mg 59m   Medications if other than standing orders:   NONE

## 2021-04-14 ENCOUNTER — Encounter
Admission: RE | Disposition: A | Discharge status: Home / Self Care | Payer: Self-pay | Source: Home / Self Care | Attending: Urology

## 2021-04-14 ENCOUNTER — Encounter: Payer: Self-pay | Admitting: Urology

## 2021-04-14 ENCOUNTER — Other Ambulatory Visit: Payer: Self-pay

## 2021-04-14 ENCOUNTER — Ambulatory Visit: Payer: 59

## 2021-04-14 ENCOUNTER — Ambulatory Visit
Admission: RE | Admit: 2021-04-14 | Discharge: 2021-04-14 | Disposition: A | Discharge status: Home / Self Care | Payer: 59 | Attending: Urology | Admitting: Urology

## 2021-04-14 DIAGNOSIS — E119 Type 2 diabetes mellitus without complications: Secondary | ICD-10-CM | POA: Insufficient documentation

## 2021-04-14 DIAGNOSIS — E669 Obesity, unspecified: Secondary | ICD-10-CM | POA: Diagnosis not present

## 2021-04-14 DIAGNOSIS — N201 Calculus of ureter: Secondary | ICD-10-CM

## 2021-04-14 DIAGNOSIS — Z6836 Body mass index (BMI) 36.0-36.9, adult: Secondary | ICD-10-CM | POA: Insufficient documentation

## 2021-04-14 HISTORY — PX: EXTRACORPOREAL SHOCK WAVE LITHOTRIPSY: SHX1557

## 2021-04-14 SURGERY — LITHOTRIPSY, ESWL
Anesthesia: Moderate Sedation | Laterality: Left

## 2021-04-14 MED ORDER — CEPHALEXIN 500 MG PO CAPS
ORAL_CAPSULE | ORAL | Status: AC
  Administered 2021-04-14: 500 mg via ORAL
  Filled 2021-04-14: qty 1

## 2021-04-14 MED ORDER — DIPHENHYDRAMINE HCL 25 MG PO CAPS: 25.0000 mg | ORAL_CAPSULE | ORAL | Status: AC

## 2021-04-14 MED ORDER — CEPHALEXIN 500 MG PO CAPS: 500.0000 mg | ORAL_CAPSULE | Freq: Once | ORAL | Status: AC

## 2021-04-14 MED ORDER — SODIUM CHLORIDE 0.9 % IV SOLN: INTRAVENOUS | Status: DC

## 2021-04-14 MED ORDER — ONDANSETRON HCL 4 MG/2ML IJ SOLN
INTRAMUSCULAR | Status: AC
  Administered 2021-04-14: 4 mg via INTRAVENOUS
  Filled 2021-04-14: qty 2

## 2021-04-14 MED ORDER — TAMSULOSIN HCL 0.4 MG PO CAPS: 0.4000 mg | ORAL_CAPSULE | Freq: Every day | ORAL | 0 refills | Status: AC

## 2021-04-14 MED ORDER — ONDANSETRON HCL 4 MG/2ML IJ SOLN: 4.0000 mg | Freq: Once | INTRAMUSCULAR | Status: AC | PRN

## 2021-04-14 MED ORDER — HYDROCODONE-ACETAMINOPHEN 5-325 MG PO TABS: 1.0000 | ORAL_TABLET | Freq: Four times a day (QID) | ORAL | 0 refills | Status: AC | PRN

## 2021-04-14 MED ORDER — DIAZEPAM 5 MG PO TABS: 10.0000 mg | ORAL_TABLET | ORAL | Status: AC

## 2021-04-14 MED ORDER — DIPHENHYDRAMINE HCL 25 MG PO CAPS
ORAL_CAPSULE | ORAL | Status: AC
  Administered 2021-04-14: 25 mg via ORAL
  Filled 2021-04-14: qty 1

## 2021-04-14 MED ORDER — DIAZEPAM 5 MG PO TABS
ORAL_TABLET | ORAL | Status: AC
  Administered 2021-04-14: 10 mg via ORAL
  Filled 2021-04-14: qty 2

## 2021-04-14 NOTE — Interval H&P Note (Signed)
History and Physical Interval Note:  04/14/2021 8:32 AM  Jon [name]  has presented today for surgery, with the diagnosis of left ureteral Stone.  The various methods of treatment have been discussed with the patient and family. After consideration of risks, benefits and other options for treatment, the patient has consented to  Procedure(s): EXTRACORPOREAL SHOCK WAVE LITHOTRIPSY (ESWL) (Left) as a surgical intervention.  The patient's history has been reviewed, patient examined, no change in status, stable for surgery.  I have reviewed the patient's chart and labs.  Questions were answered to the patient's satisfaction.    RRR CTAB  [name]

## 2021-04-14 NOTE — Discharge Instructions (Addendum)
See [facility] discharge instructions in chart.   AMBULATORY SURGERY  DISCHARGE INSTRUCTIONS   The drugs that you were given will stay in your system until tomorrow so for the next 24 hours you should not:  Drive an automobile Make any legal decisions Drink any alcoholic beverage   You may resume regular meals tomorrow.  Today it is better to start with liquids and gradually work up to solid foods.  You may eat anything you prefer, but it is better to start with liquids, then soup and crackers, and gradually work up to solid foods.   Please notify your doctor immediately if you have any unusual bleeding, trouble breathing, redness and pain at the surgery site, drainage, fever, or pain not relieved by medication.    Your post-operative visit with Dr.                                       is: Date:                        Time:    Please call to schedule your post-operative visit.  Additional Instructions: 

## 2021-04-15 ENCOUNTER — Encounter: Payer: Self-pay | Admitting: Urology

## 2021-04-19 ENCOUNTER — Other Ambulatory Visit: Payer: Self-pay

## 2021-04-19 DIAGNOSIS — N201 Calculus of ureter: Secondary | ICD-10-CM

## 2021-05-03 ENCOUNTER — Ambulatory Visit: Payer: 59 | Admitting: Physician Assistant

## 2021-05-17 ENCOUNTER — Encounter: Payer: Self-pay | Admitting: Physician Assistant

## 2021-05-17 ENCOUNTER — Other Ambulatory Visit: Payer: Self-pay

## 2021-05-17 ENCOUNTER — Ambulatory Visit (INDEPENDENT_AMBULATORY_CARE_PROVIDER_SITE_OTHER): Payer: 59 | Admitting: Physician Assistant

## 2021-05-17 VITALS — BP 156/94 | HR 84 | Ht 70.0 in | Wt 257.0 lb

## 2021-05-17 DIAGNOSIS — N201 Calculus of ureter: Secondary | ICD-10-CM | POA: Diagnosis not present

## 2021-05-17 NOTE — Progress Notes (Signed)
05/17/2021 5:10 PM   Jon [name] 1976/02/09 [id number]  CC: Chief Complaint  Patient presents with   Nephrolithiasis   HPI: Jon [name] is a 45 y.o. male who underwent ESWL with Dr. Erlene Quan on 04/14/2021 for management of a 4 mm distal left ureteral stone who presents today for postop follow-up.   Today he reports he passed a small volume of debris immediately following his procedure followed by what he believes to be the entirety of his stone 5 days afterward.  His pain has since resolved and he denies gross hematuria.  He left the stone at home today.  He declined KUB today.  In-office UA today pan negative; urine microscopy with amorphous crystals.  PMH: No past medical history on file.  Surgical History: Past Surgical History:  Procedure Laterality Date   CHOLECYSTECTOMY  08/2017   CYSTOSCOPY/URETEROSCOPY/HOLMIUM LASER/STENT PLACEMENT Right 12/20/2017   Procedure: CYSTOSCOPY/URETEROSCOPY/HOLMIUM LASER/STENT PLACEMENT;  Surgeon: Abbie Sons, MD;  Location: [facility] ORS;  Service: Urology;  Laterality: Right;   EXTRACORPOREAL SHOCK WAVE LITHOTRIPSY Left 04/14/2021   Procedure: EXTRACORPOREAL SHOCK WAVE LITHOTRIPSY (ESWL);  Surgeon: Hollice Espy, MD;  Location: [facility] ORS;  Service: Urology;  Laterality: Left;   EYE SURGERY      Home Medications:  Allergies as of 05/17/2021       Reactions   Vancomycin Itching        Medication List        Accurate as of May 17, 2021  5:10 PM. If you have any questions, ask your nurse or doctor.          esomeprazole 20 MG packet Commonly known as: NexIUM Take 20 mg by mouth daily before breakfast.   famotidine 20 MG tablet Commonly known as: PEPCID Take 1 tablet (20 mg total) by mouth 2 (two) times daily.   fluticasone 50 MCG/ACT nasal spray Commonly known as: FLONASE Place 2 sprays into both nostrils daily.   HYDROcodone-acetaminophen 5-325 MG tablet Commonly known as: NORCO/VICODIN Take 1-2  tablets by mouth every 6 (six) hours as needed for moderate pain.   metFORMIN 500 MG tablet Commonly known as: GLUCOPHAGE Take 500 mg by mouth daily with breakfast.   tamsulosin 0.4 MG Caps capsule Commonly known as: Flomax Take 1 capsule (0.4 mg total) by mouth daily.        Allergies:  Allergies  Allergen Reactions   Vancomycin Itching    Family History: No family history on file.  Social History:   reports that he has quit smoking. He quit smokeless tobacco use about 3 years ago.  His smokeless tobacco use included snuff. He reports that he does not drink alcohol and does not use drugs.  Physical Exam: BP (!) 156/94 (BP Location: Left Arm, Patient Position: Sitting, Cuff Size: Large)    Pulse 84    Ht 5\' 10"  (1.778 m)    Wt 257 lb (116.6 kg)    BMI 36.88 kg/m   Constitutional:  Alert and oriented, no acute distress, nontoxic appearing HEENT: [place], AT Cardiovascular: No clubbing, cyanosis, or edema Respiratory: Normal respiratory effort, no increased work of breathing Skin: No rashes, bruises or suspicious lesions Neurologic: Grossly intact, no focal deficits, moving all 4 extremities Psychiatric: Normal mood and affect  Laboratory Data: Results for orders placed or performed in visit on 05/17/21  Microscopic Examination   Urine  Result Value Ref Range   WBC, UA 0-5 0 - 5 /hpf   RBC None seen 0 - 2 /hpf  Epithelial Cells (non renal) None seen 0 - 10 /hpf   Bacteria, UA None seen None seen/Few  Urinalysis, Complete  Result Value Ref Range   Specific Gravity, UA 1.020 1.005 - 1.030   pH, UA 6.5 5.0 - 7.5   Color, UA Yellow Yellow   Appearance Ur Clear Clear   Leukocytes,UA Negative Negative   Protein,UA Negative Negative/Trace   Glucose, UA Negative Negative   Ketones, UA Negative Negative   RBC, UA Negative Negative   Bilirubin, UA Negative Negative   Urobilinogen, Ur 1.0 0.2 - 1.0 mg/dL   Nitrite, UA Negative Negative   Microscopic Examination See below:     Assessment & Plan:   1. Left ureteral stone UA benign today and pain resolved.  We discussed that I cannot definitively rule out retained fragment in the absence of imaging.  I asked him to bring his stone to clinic for analysis at his earliest convenience and he expressed understanding.  We will reach out to him via MyChart with stone prevention recommendations per results.  We did discuss increasing fluid intake today for stone prevention. - Urinalysis, Complete - Calculi, with Photograph (to Clinical Lab); Future   Return if symptoms worsen or fail to improve.  Carman Ching, PA-C  [facility] [address] [place], [place] [address] [phone]

## 2021-05-18 LAB — URINALYSIS, COMPLETE
Bilirubin, UA: NEGATIVE
Glucose, UA: NEGATIVE
Ketones, UA: NEGATIVE
Leukocytes,UA: NEGATIVE
Nitrite, UA: NEGATIVE
Protein,UA: NEGATIVE
RBC, UA: NEGATIVE
Specific Gravity, UA: 1.02 (ref 1.005–1.030)
Urobilinogen, Ur: 1 mg/dL (ref 0.2–1.0)
pH, UA: 6.5 (ref 5.0–7.5)

## 2021-05-18 LAB — MICROSCOPIC EXAMINATION
Bacteria, UA: NONE SEEN
Epithelial Cells (non renal): NONE SEEN /hpf (ref 0–10)
RBC, Urine: NONE SEEN /hpf (ref 0–2)

## 2021-12-31 IMAGING — CT CT RENAL STONE PROTOCOL
2 of 4 series · 16 of 46 positions shown, 18 images · non-contrast
Comparison: Radiograph March 15, 2021 and CT December 16, 2017

CLINICAL DATA: Evaluation for possible left mid ureteral calculus
seen on prior KUB

EXAM:
CT ABDOMEN AND PELVIS WITHOUT CONTRAST
TECHNIQUE: Multidetector CT imaging of the abdomen and pelvis was performed
following the standard protocol without IV contrast.

[Series 2: renal stone 5.00 · axial · 0.83mm/px · z∈[-1565,-1105]mm · 13 of 102 slices shown, 15 images]
[im 5/102  soft-tissue]
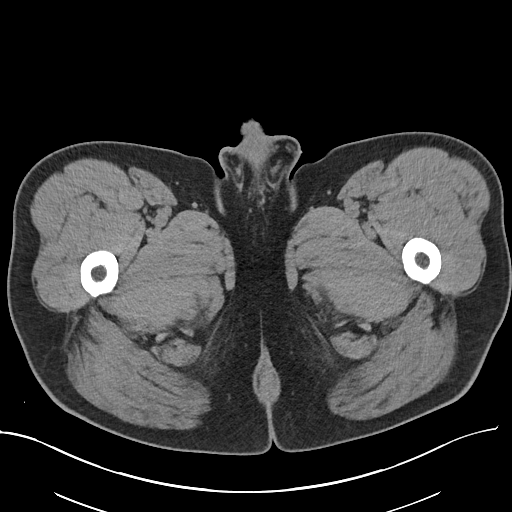
[im 5/102  bone]
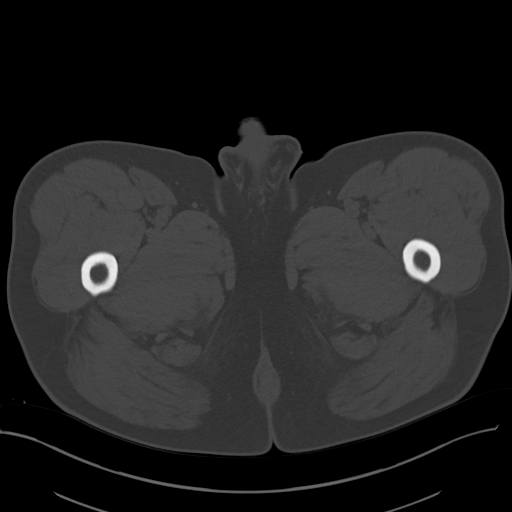
[im 13/102  soft-tissue]
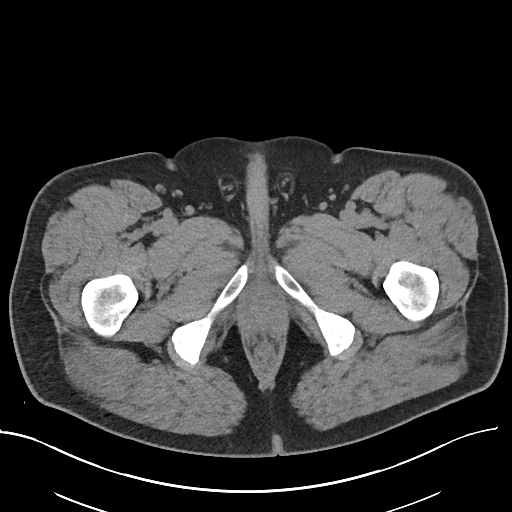
[im 21/102  soft-tissue]
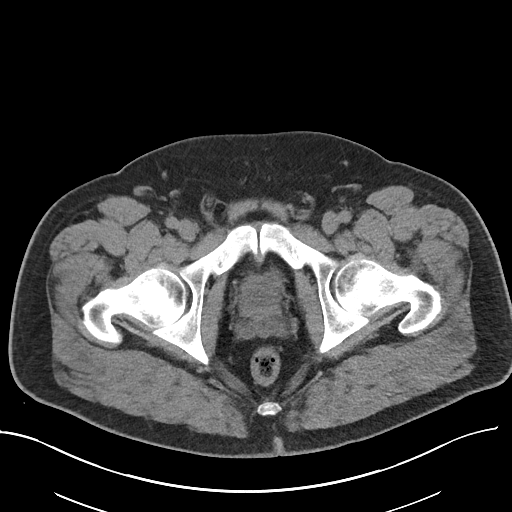
[im 29/102  soft-tissue]
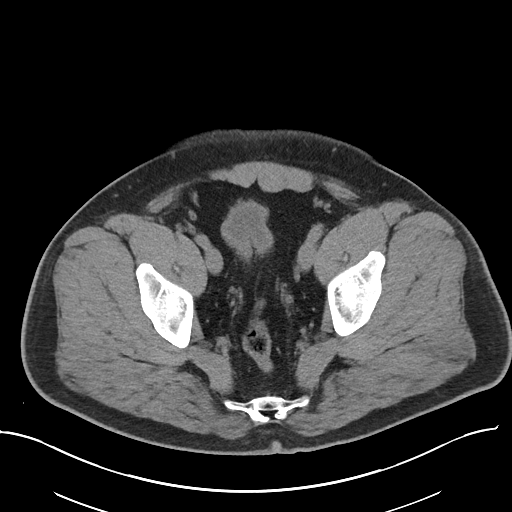
[im 37/102  soft-tissue]
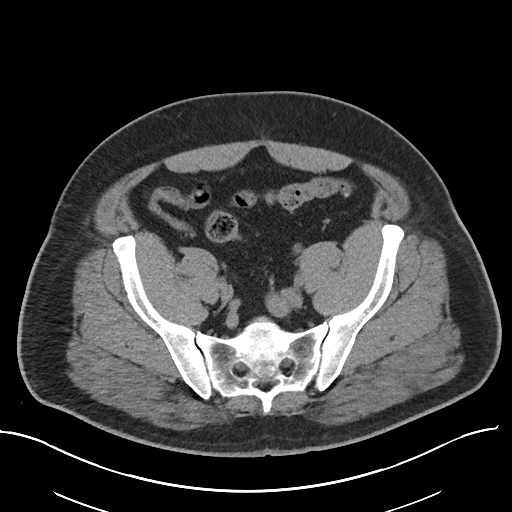
[im 45/102  soft-tissue]
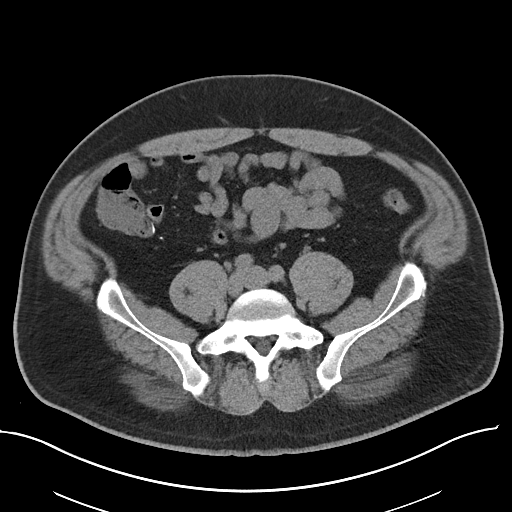
[im 53/102  soft-tissue]
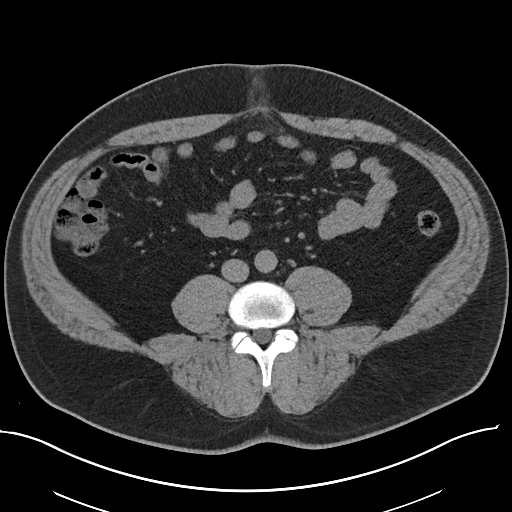
[im 57/102  soft-tissue]
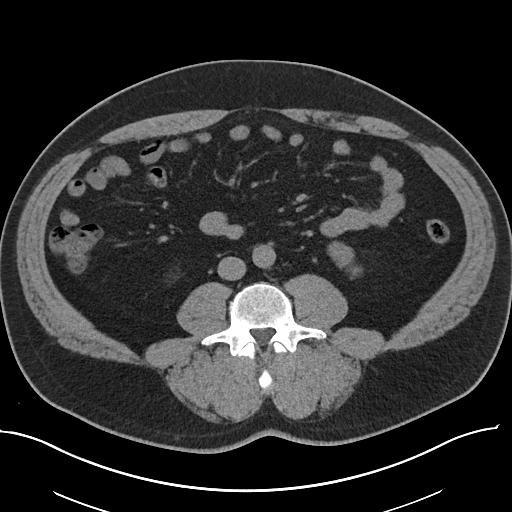
[im 65/102  soft-tissue]
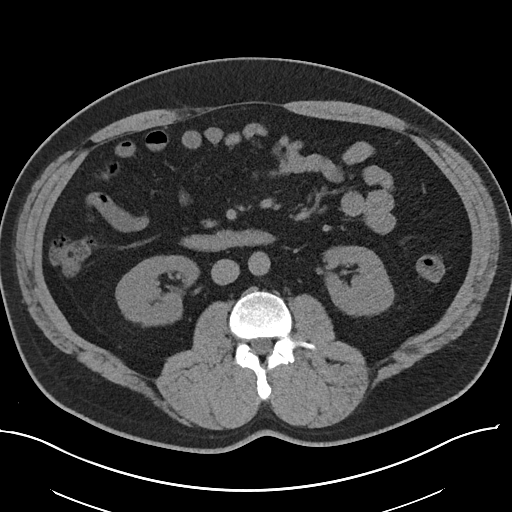
[im 65/102  bone]
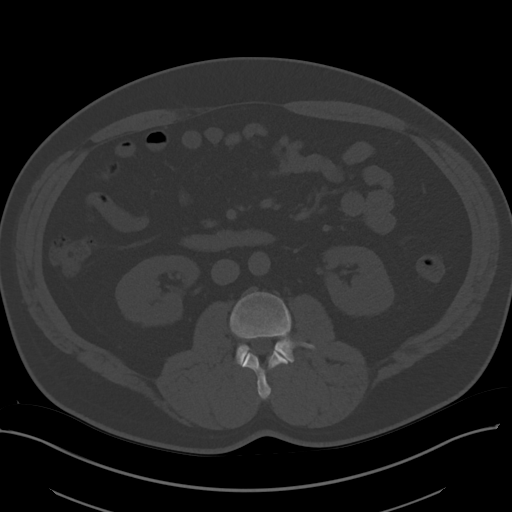
[im 73/102  soft-tissue]
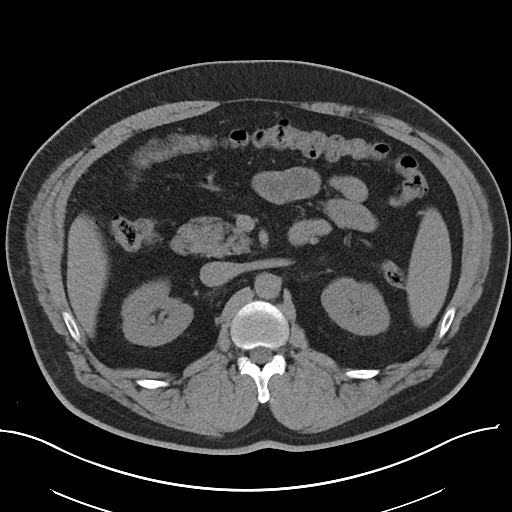
[im 81/102  soft-tissue]
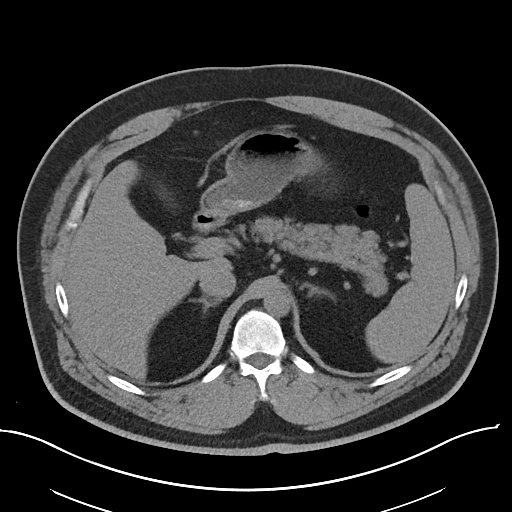
[im 89/102  soft-tissue]
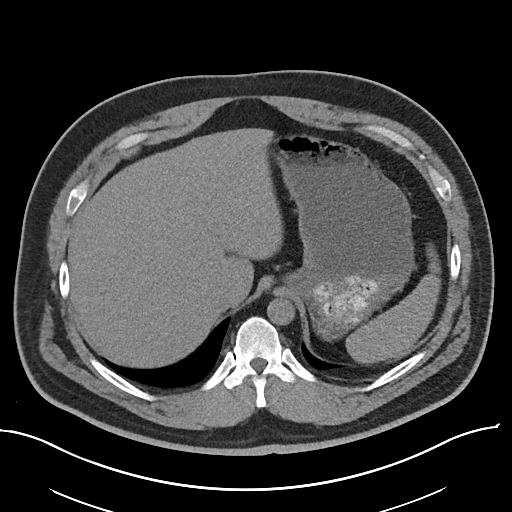
[im 97/102  soft-tissue]
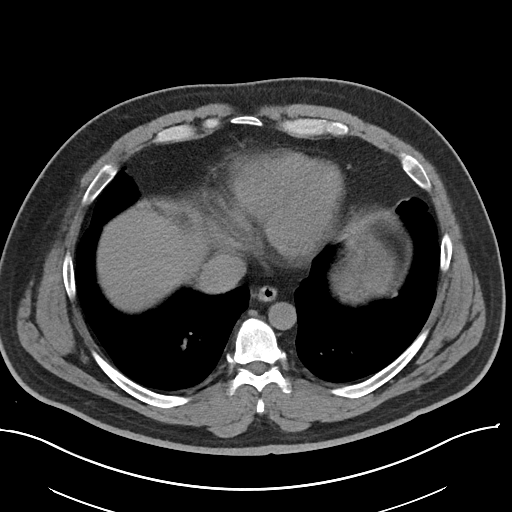

[Series 4: renal stone 2.00 cor · coronal · 0.87mm/px · 3 of 168 slices shown]
[im 56/168  soft-tissue]
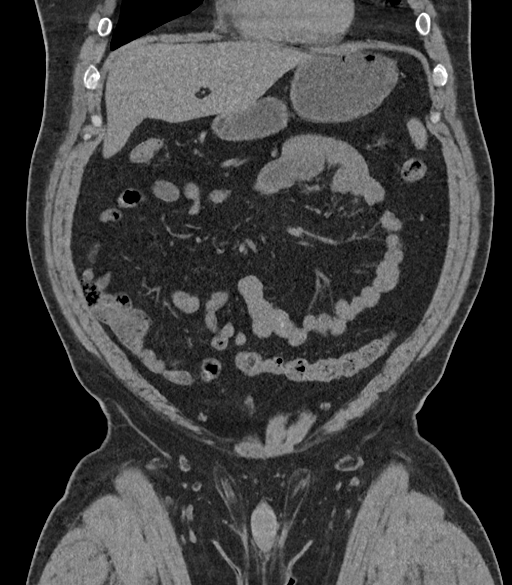
[im 75/168  soft-tissue]
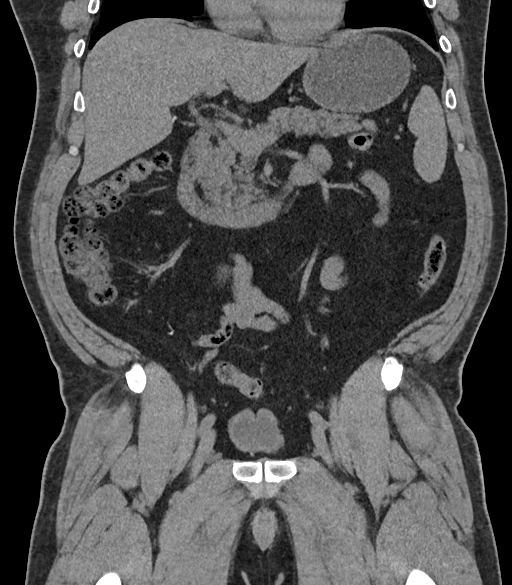
[im 93/168  soft-tissue]
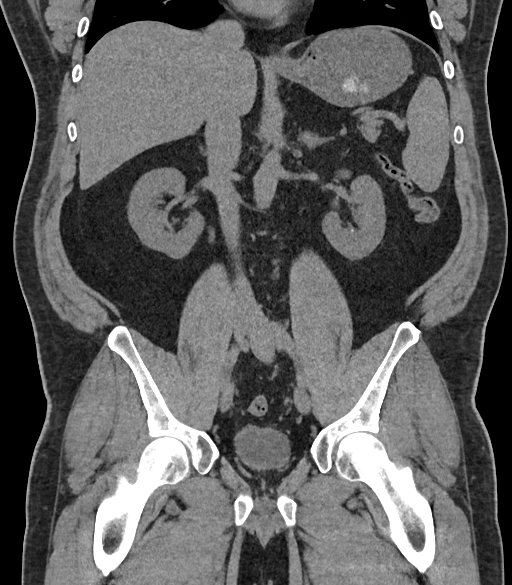

[16 of 46 positions shown; findings below may reference images not displayed]

FINDINGS: Lower chest: No acute abnormality.

Hepatobiliary: Unremarkable noncontrast appearance of the hepatic
parenchyma. Gallbladder surgically absent. No biliary ductal
dilation.

Pancreas: Pancreatic ductal dilation or evidence of acute
inflammation.

Spleen: Within normal limits.

Adrenals/Urinary Tract: Bilateral adrenal glands appear within
limits.

No hydronephrosis. Bilateral punctate nonobstructive renal stones.
Prominent left ureter to the level of a 4 mm stone in the distal
left ureter just proximal to the ureterovesicular junction. Urinary
bladder is unremarkable for degree of distension.

Stomach/Bowel: Stomach is distended with ingested material without
focal wall thickening. No pathologic dilation of small or large
bowel. The appendix is surgically absent. Terminal ileum appears
normal. Sigmoid colonic diverticulosis without findings of acute
diverticulitis. No evidence of acute bowel inflammation.

Vascular/Lymphatic: No abdominal aortic aneurysm. No pathologically
enlarged abdominopelvic lymph nodes.

Reproductive: Prostate is unremarkable.

Other: No abdominopelvic free fluid. Small fat containing
paraumbilical hernia.

Musculoskeletal: No acute or significant osseous findings.
IMPRESSION: 1. 4 mm distal left ureteral stone just proximal to the
ureterovesicular junction. No hydronephrosis.
2. Bilateral punctate nonobstructive renal stones.
3. Sigmoid colonic diverticulosis without findings of acute
diverticulitis.

## 2022-01-11 IMAGING — CR DG ABDOMEN 1V
1 series · 2 of 2 positions shown · non-contrast
Comparison: 04/03/2021

CLINICAL DATA: Left ureteral calculus, preoperative evaluation

EXAM:
ABDOMEN - 1 VIEW

[Series 1: dg abd 1 view · 0.14mm/px · 2 of 2 slices shown]
[im 1/2]
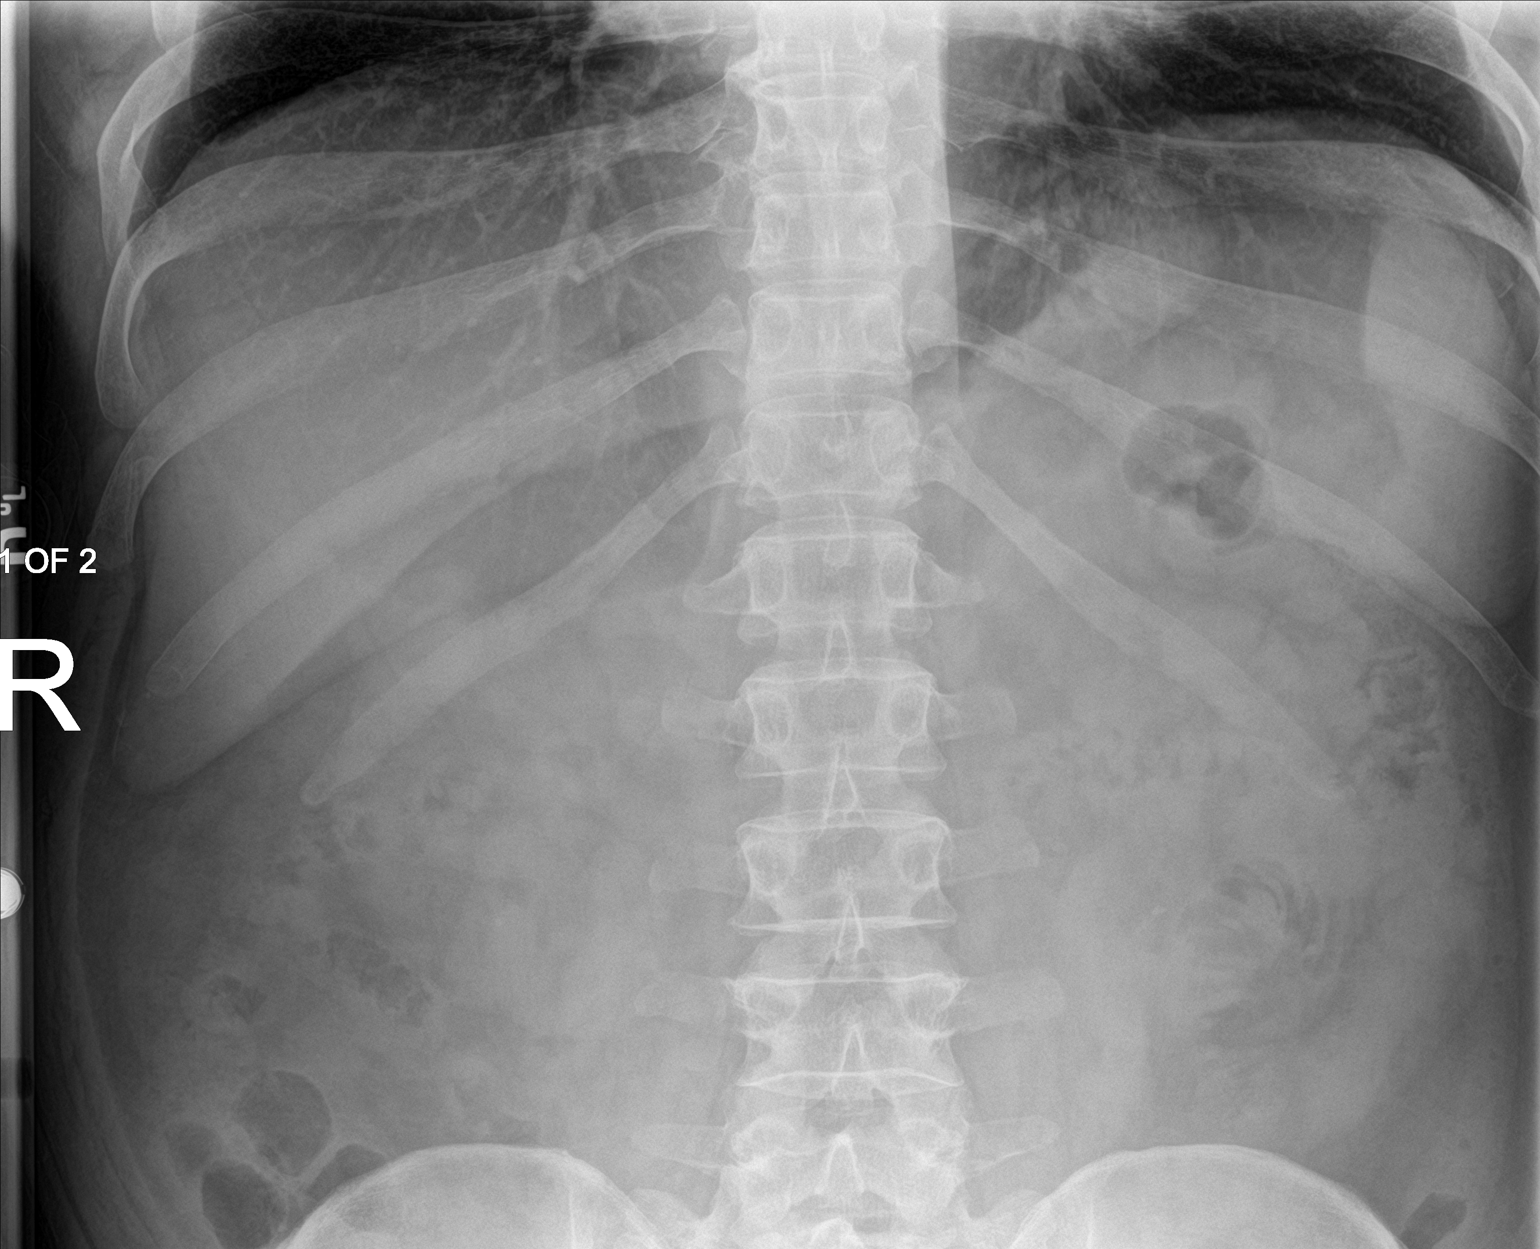
[im 2/2]
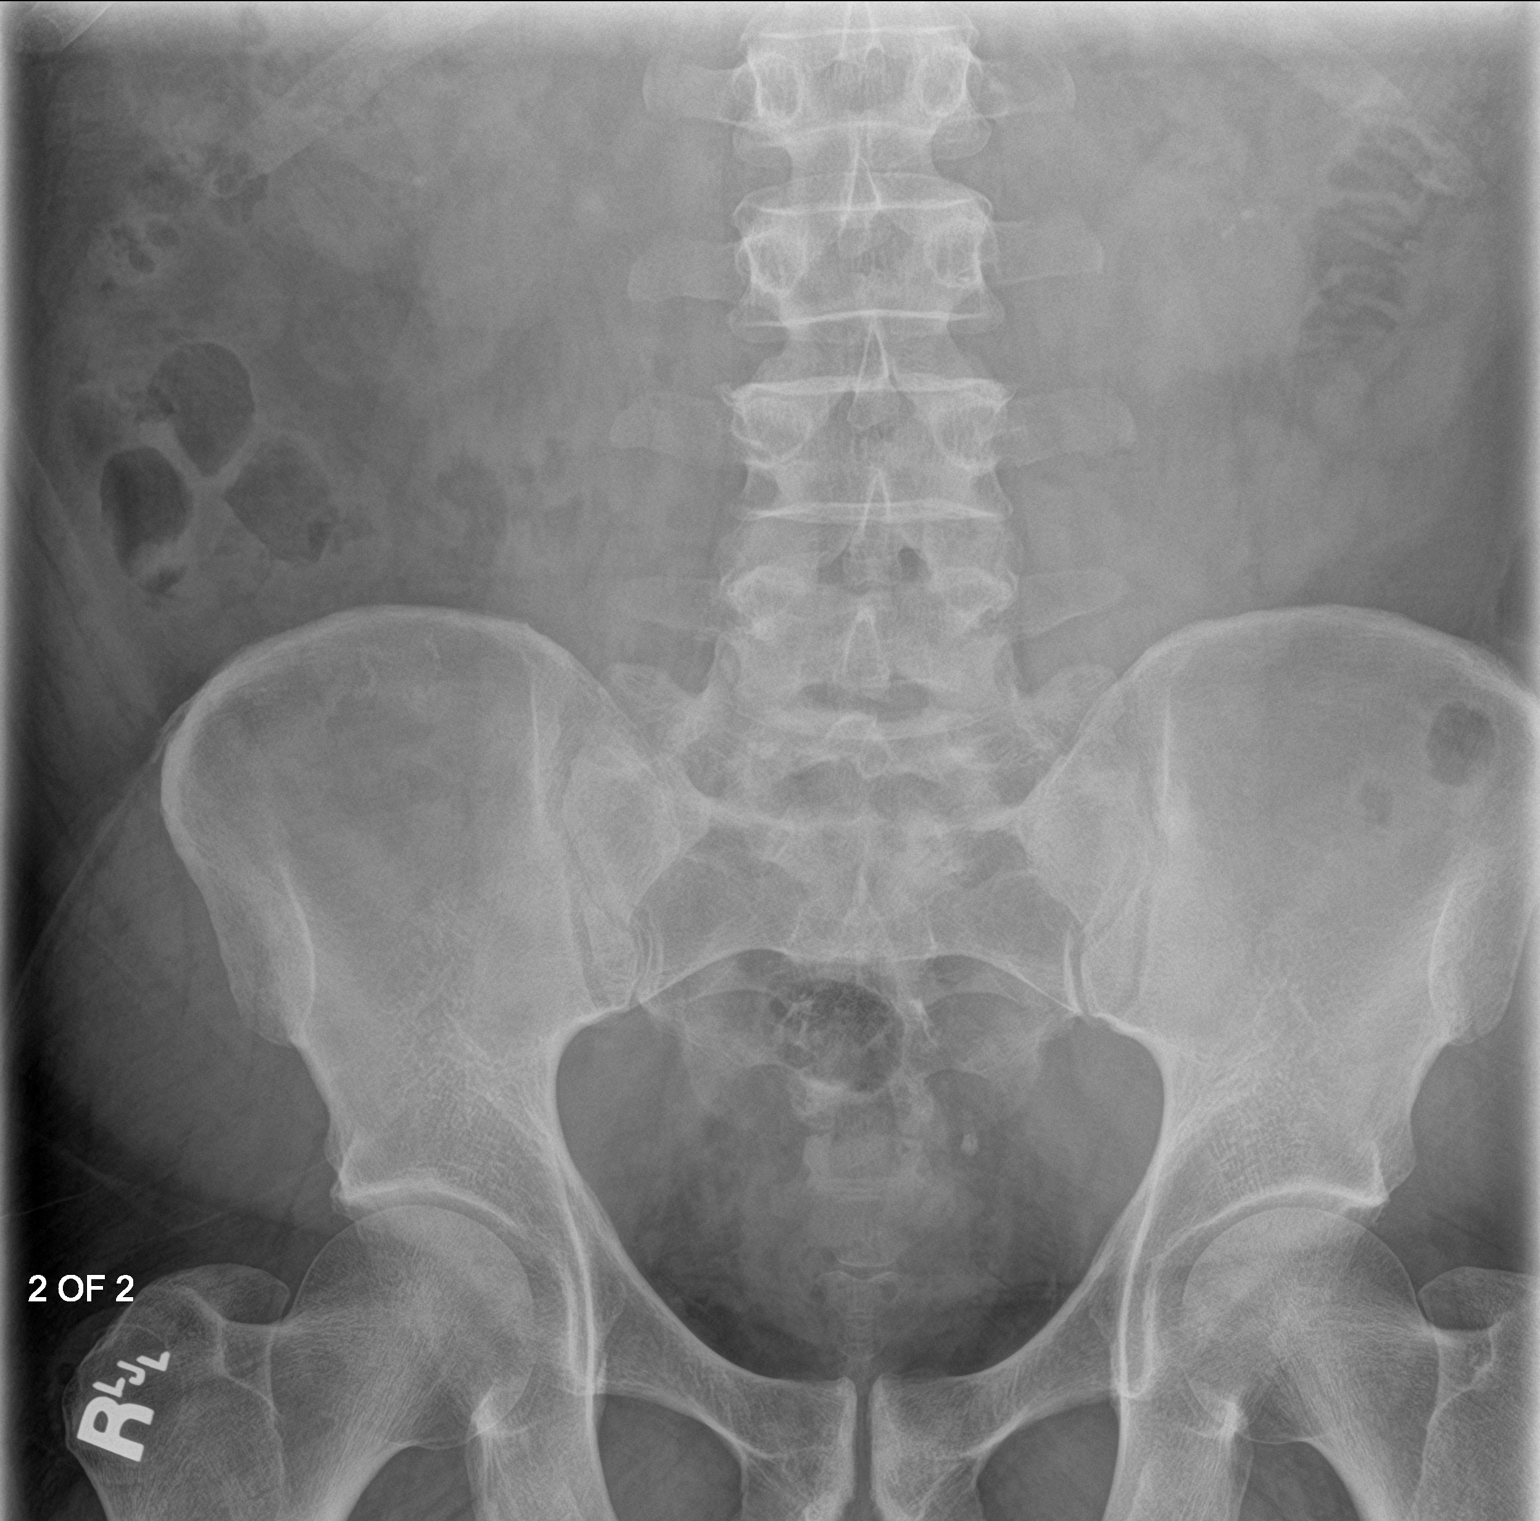

[2 of 2 positions shown; findings below may reference images not displayed]

FINDINGS: Renal shadows are obscured by stool and gas pattern. Similar
subcentimeter radiopaque calculi in the left kidney lower pole.

Stable 4 mm calculus over the left sacrum in the pelvis previously
demonstrated to be a distal left ureteral calculus by CT.

No acute osseous finding. Nonobstructive bowel gas pattern. Clear
lung bases.
IMPRESSION: 4 mm left distal ureteral calculus projects over the left sacrum,
unchanged in appearance and location.

Additional punctate nonobstructing nephrolithiasis.

## 2022-03-15 ENCOUNTER — Ambulatory Visit: Payer: 59 | Admitting: Urology

## 2022-03-16 ENCOUNTER — Ambulatory Visit: Payer: 59 | Admitting: Urology
